# Patient Record
Sex: Male | Born: 1955 | Race: White | Hispanic: No | Marital: Married | Smoking: Never smoker
Health system: Southern US, Community
[De-identification: ages and names within clinical notes are randomized; demographics above are authoritative.]

## PROBLEM LIST (undated history)

## (undated) DIAGNOSIS — G459 Transient cerebral ischemic attack, unspecified: Secondary | ICD-10-CM

## (undated) DIAGNOSIS — C801 Malignant (primary) neoplasm, unspecified: Secondary | ICD-10-CM

## (undated) DIAGNOSIS — I10 Essential (primary) hypertension: Secondary | ICD-10-CM

## (undated) HISTORY — PX: CORONARY ARTERY BYPASS GRAFT: SHX141

---

## 2007-05-24 ENCOUNTER — Emergency Department: Payer: Self-pay | Admitting: Emergency Medicine

## 2008-11-18 ENCOUNTER — Emergency Department: Payer: Self-pay | Admitting: Emergency Medicine

## 2015-10-28 ENCOUNTER — Encounter: Payer: Self-pay | Admitting: Emergency Medicine

## 2015-10-28 ENCOUNTER — Observation Stay: Payer: BLUE CROSS/BLUE SHIELD

## 2015-10-28 ENCOUNTER — Emergency Department: Payer: BLUE CROSS/BLUE SHIELD

## 2015-10-28 ENCOUNTER — Observation Stay
Admit: 2015-10-28 | Discharge: 2015-10-28 | Disposition: A | Discharge status: Home / Self Care | Payer: BLUE CROSS/BLUE SHIELD | Attending: Internal Medicine | Admitting: Internal Medicine

## 2015-10-28 ENCOUNTER — Observation Stay
Admission: EM | Admit: 2015-10-28 | Discharge: 2015-10-29 | Disposition: A | Discharge status: Home / Self Care | Payer: BLUE CROSS/BLUE SHIELD | Attending: Internal Medicine | Admitting: Internal Medicine

## 2015-10-28 DIAGNOSIS — Z6832 Body mass index (BMI) 32.0-32.9, adult: Secondary | ICD-10-CM | POA: Diagnosis not present

## 2015-10-28 DIAGNOSIS — Z801 Family history of malignant neoplasm of trachea, bronchus and lung: Secondary | ICD-10-CM | POA: Insufficient documentation

## 2015-10-28 DIAGNOSIS — Z951 Presence of aortocoronary bypass graft: Secondary | ICD-10-CM | POA: Diagnosis not present

## 2015-10-28 DIAGNOSIS — R739 Hyperglycemia, unspecified: Secondary | ICD-10-CM | POA: Diagnosis not present

## 2015-10-28 DIAGNOSIS — R200 Anesthesia of skin: Secondary | ICD-10-CM | POA: Insufficient documentation

## 2015-10-28 DIAGNOSIS — J3489 Other specified disorders of nose and nasal sinuses: Secondary | ICD-10-CM | POA: Diagnosis not present

## 2015-10-28 DIAGNOSIS — E785 Hyperlipidemia, unspecified: Secondary | ICD-10-CM | POA: Diagnosis not present

## 2015-10-28 DIAGNOSIS — E669 Obesity, unspecified: Secondary | ICD-10-CM

## 2015-10-28 DIAGNOSIS — I10 Essential (primary) hypertension: Secondary | ICD-10-CM

## 2015-10-28 DIAGNOSIS — I639 Cerebral infarction, unspecified: Secondary | ICD-10-CM

## 2015-10-28 DIAGNOSIS — G459 Transient cerebral ischemic attack, unspecified: Principal | ICD-10-CM | POA: Diagnosis present

## 2015-10-28 DIAGNOSIS — Z8249 Family history of ischemic heart disease and other diseases of the circulatory system: Secondary | ICD-10-CM | POA: Insufficient documentation

## 2015-10-28 DIAGNOSIS — R531 Weakness: Secondary | ICD-10-CM | POA: Insufficient documentation

## 2015-10-28 DIAGNOSIS — Z7982 Long term (current) use of aspirin: Secondary | ICD-10-CM | POA: Insufficient documentation

## 2015-10-28 DIAGNOSIS — Z8349 Family history of other endocrine, nutritional and metabolic diseases: Secondary | ICD-10-CM | POA: Insufficient documentation

## 2015-10-28 DIAGNOSIS — Z888 Allergy status to other drugs, medicaments and biological substances status: Secondary | ICD-10-CM | POA: Insufficient documentation

## 2015-10-28 DIAGNOSIS — I2510 Atherosclerotic heart disease of native coronary artery without angina pectoris: Secondary | ICD-10-CM | POA: Diagnosis not present

## 2015-10-28 DIAGNOSIS — N289 Disorder of kidney and ureter, unspecified: Secondary | ICD-10-CM | POA: Diagnosis not present

## 2015-10-28 HISTORY — DX: Essential (primary) hypertension: I10

## 2015-10-28 LAB — PROTIME-INR
INR: 0.95
Prothrombin Time: 12.9 seconds (ref 11.4–15.0)

## 2015-10-28 LAB — APTT: aPTT: 28 seconds (ref 24–36)

## 2015-10-28 LAB — CBC
HEMATOCRIT: 47.4 % (ref 40.0–52.0)
HEMOGLOBIN: 16.2 g/dL (ref 13.0–18.0)
MCH: 30.6 pg (ref 26.0–34.0)
MCHC: 34.2 g/dL (ref 32.0–36.0)
MCV: 89.5 fL (ref 80.0–100.0)
PLATELETS: 205 10*3/uL (ref 150–440)
RBC: 5.29 MIL/uL (ref 4.40–5.90)
RDW: 14.5 % (ref 11.5–14.5)
WBC: 8 10*3/uL (ref 3.8–10.6)

## 2015-10-28 LAB — COMPREHENSIVE METABOLIC PANEL
ALBUMIN: 4.6 g/dL (ref 3.5–5.0)
ALK PHOS: 57 U/L (ref 38–126)
ALT: 26 U/L (ref 17–63)
ANION GAP: 6 (ref 5–15)
AST: 28 U/L (ref 15–41)
BUN: 18 mg/dL (ref 6–20)
CALCIUM: 9.2 mg/dL (ref 8.9–10.3)
CO2: 27 mmol/L (ref 22–32)
Chloride: 107 mmol/L (ref 101–111)
Creatinine, Ser: 1.27 mg/dL — ABNORMAL HIGH (ref 0.61–1.24)
GFR calc Af Amer: >60 mL/min (ref >60)
GFR calc non Af Amer: 59 mL/min — ABNORMAL LOW (ref >60)
GLUCOSE: 106 mg/dL — AB (ref 65–99)
POTASSIUM: 4 mmol/L (ref 3.5–5.1)
SODIUM: 140 mmol/L (ref 135–145)
Total Bilirubin: 0.5 mg/dL (ref 0.3–1.2)
Total Protein: 7.3 g/dL (ref 6.5–8.1)

## 2015-10-28 LAB — DIFFERENTIAL
BASOS ABS: 0.1 10*3/uL (ref 0–0.1)
Basophils Relative: 1 %
Eosinophils Absolute: 0.1 10*3/uL (ref 0–0.7)
Eosinophils Relative: 1 %
LYMPHS ABS: 1.5 10*3/uL (ref 1.0–3.6)
Lymphocytes Relative: 19 %
MONOS PCT: 11 %
Monocytes Absolute: 0.9 10*3/uL (ref 0.2–1.0)
NEUTROS PCT: 68 %
Neutro Abs: 5.4 10*3/uL (ref 1.4–6.5)

## 2015-10-28 LAB — GLUCOSE, CAPILLARY: Glucose-Capillary: 122 mg/dL — ABNORMAL HIGH (ref 65–99)

## 2015-10-28 LAB — TROPONIN I: Troponin I: <0.03 ng/mL (ref <0.031)

## 2015-10-28 MED ORDER — ONDANSETRON HCL 4 MG PO TABS
4.0000 mg | ORAL_TABLET | Freq: Four times a day (QID) | ORAL | Status: DC | PRN
Start: 2015-10-28 — End: 2015-10-29

## 2015-10-28 MED ORDER — TRAZODONE HCL 50 MG PO TABS
25.0000 mg | ORAL_TABLET | Freq: Every evening | ORAL | Status: DC | PRN
  Administered 2015-10-28: 23:00:00 25 mg via ORAL
  Filled 2015-10-28 (×2): qty 1

## 2015-10-28 MED ORDER — ASPIRIN 81 MG PO CHEW
324.0000 mg | CHEWABLE_TABLET | Freq: Once | ORAL | Status: AC
  Administered 2015-10-28: 324 mg via ORAL
  Filled 2015-10-28: qty 4

## 2015-10-28 MED ORDER — ASPIRIN 300 MG RE SUPP: 300.0000 mg | Freq: Every day | RECTAL | Status: DC

## 2015-10-28 MED ORDER — SODIUM CHLORIDE 0.9% FLUSH
3.0000 mL | Freq: Two times a day (BID) | INTRAVENOUS | Status: DC
  Administered 2015-10-28 (×2): 3 mL via INTRAVENOUS

## 2015-10-28 MED ORDER — STROKE: EARLY STAGES OF RECOVERY BOOK
Freq: Once | Status: AC
  Administered 2015-10-28: 11:00:00

## 2015-10-28 MED ORDER — ACETAMINOPHEN 325 MG PO TABS: 650.0000 mg | ORAL_TABLET | Freq: Four times a day (QID) | ORAL | Status: DC | PRN

## 2015-10-28 MED ORDER — ACETAMINOPHEN 650 MG RE SUPP: 650.0000 mg | Freq: Four times a day (QID) | RECTAL | Status: DC | PRN

## 2015-10-28 MED ORDER — NITROGLYCERIN 0.4 MG SL SUBL: 0.4000 mg | SUBLINGUAL_TABLET | SUBLINGUAL | Status: DC | PRN

## 2015-10-28 MED ORDER — VITAMIN D3 25 MCG (1000 UNIT) PO TABS
1000.0000 [IU] | ORAL_TABLET | Freq: Every day | ORAL | Status: DC
  Filled 2015-10-28: qty 1

## 2015-10-28 MED ORDER — ADULT MULTIVITAMIN W/MINERALS CH: 1.0000 | ORAL_TABLET | Freq: Every day | ORAL | Status: DC

## 2015-10-28 MED ORDER — ENOXAPARIN SODIUM 40 MG/0.4ML ~~LOC~~ SOLN
40.0000 mg | SUBCUTANEOUS | Status: DC
  Administered 2015-10-28: 40 mg via SUBCUTANEOUS
  Filled 2015-10-28: qty 0.4

## 2015-10-28 MED ORDER — ONDANSETRON HCL 4 MG/2ML IJ SOLN: 4.0000 mg | Freq: Four times a day (QID) | INTRAMUSCULAR | Status: DC | PRN

## 2015-10-28 MED ORDER — ROSUVASTATIN CALCIUM 20 MG PO TABS
20.0000 mg | ORAL_TABLET | Freq: Every day | ORAL | Status: DC
  Administered 2015-10-28: 22:00:00 20 mg via ORAL
  Filled 2015-10-28: qty 1

## 2015-10-28 MED ORDER — LORAZEPAM 2 MG/ML IJ SOLN
1.0000 mg | Freq: Once | INTRAMUSCULAR | Status: AC
  Administered 2015-10-28: 13:00:00 1 mg via INTRAVENOUS
  Filled 2015-10-28: qty 1

## 2015-10-28 MED ORDER — ASPIRIN 325 MG PO TABS: 325.0000 mg | ORAL_TABLET | Freq: Every day | ORAL | Status: DC

## 2015-10-28 MED ORDER — OXYCODONE HCL 5 MG PO TABS: 5.0000 mg | ORAL_TABLET | ORAL | Status: DC | PRN

## 2015-10-28 MED ORDER — MORPHINE SULFATE (PF) 2 MG/ML IV SOLN: 2.0000 mg | INTRAVENOUS | Status: DC | PRN

## 2015-10-28 NOTE — ED Notes (Signed)
Pt currently speaking to [facility], stroke coordinator at bedside

## 2015-10-28 NOTE — ED Notes (Signed)
Dr. Reynolds at bedside.

## 2015-10-28 NOTE — ED Provider Notes (Signed)
[facility] Emergency Department Provider Note  ____________________________________________  Time seen: Approximately 8:58 AM  I have reviewed the triage vital signs and the nursing notes.   HISTORY  Chief Complaint Extremity Weakness    HPI Joel Miranda is a 60 y.o. male with history of hypertension, history of coronary artery disease status post CABG who presents for evaluation of transient right arm numbness and weakness today which began at 6:30 AM and resolves less than a minute later, sudden onset, moderate, no modifying factors. Patient reports that he was attempting to brush his teeth when he reached for his toothbrush and noted that his right arm was weak, numb and tingly. This resolved quickly. He also had some lightheadedness at that time. No chest pain or difficulty breathing, vomiting, diarrhea, fevers or chills. No recent illness.   Past Medical History  Diagnosis Date  . Hypertension     There are no active problems to display for this patient.   Past Surgical History  Procedure Laterality Date  . Coronary artery bypass graft      No current outpatient prescriptions on file.  Allergies Review of patient's allergies indicates no known allergies.  No family history on file.  Social History Social History  Substance Use Topics  . Smoking status: Never Smoker   . Smokeless tobacco: None  . Alcohol Use: None    Review of Systems Constitutional: No fever/chills Eyes: No visual changes. ENT: No sore throat. Cardiovascular: Denies chest pain. Respiratory: Denies shortness of breath. Gastrointestinal: No abdominal pain.  No nausea, no vomiting.  No diarrhea.  No constipation. Genitourinary: Negative for dysuria. Musculoskeletal: Negative for back pain. Skin: Negative for rash. Neurological: Negative for headaches, focal weakness or numbness.  10-point ROS otherwise  negative.  ____________________________________________   PHYSICAL EXAM:  Filed Vitals:   10/28/15 0857 10/28/15 0930  BP: 148/89 132/74  Pulse: 72 75  Temp: 97.8 F (36.6 C)   TempSrc: Oral   Resp: 18 14  Weight: 250 lb 12.8 oz (113.762 kg)   SpO2: 99% 96%      Constitutional: Alert and oriented. Well appearing and in no acute distress. Eyes: Conjunctivae are normal. PERRL. EOMI. Head: Atraumatic. Nose: No congestion/rhinnorhea. Mouth/Throat: Mucous membranes are moist.  Oropharynx non-erythematous. Neck: No stridor.  Supple without meningismus. Cardiovascular: Normal rate, regular rhythm. Grossly normal heart sounds.  Good peripheral circulation. Respiratory: Normal respiratory effort.  No retractions. Lungs CTAB. Gastrointestinal: Soft and nontender. No distention.  No CVA tenderness. Genitourinary: deferred Musculoskeletal: No lower extremity tenderness nor edema.  No joint effusions. Neurologic:  Normal speech and language. No gross focal neurologic deficits are appreciated. 5 out of 5 strength in bilateral upper and lower extremities, sensation intact to light touch throughout, cranial nerves II through XII intact, normal finger-nose-finger. Skin:  Skin is warm, dry and intact. No rash noted. Psychiatric: Mood and affect are normal. Speech and behavior are normal.  ____________________________________________   LABS (all labs ordered are listed, but only abnormal results are displayed)  Labs Reviewed  COMPREHENSIVE METABOLIC PANEL - Abnormal; Notable for the following:    Glucose, Bld 106 (*)    Creatinine, Ser 1.27 (*)    GFR calc non Af Amer 59 (*)    All other components within normal limits  GLUCOSE, CAPILLARY - Abnormal; Notable for the following:    Glucose-Capillary 122 (*)    All other components within normal limits  PROTIME-INR  APTT  CBC  TROPONIN I  DIFFERENTIAL  CBG MONITORING,  ED   ____________________________________________  EKG  ED  ECG REPORT I, Joanne Gavel, the attending physician, personally viewed and interpreted this ECG.   Date: 10/28/2015  EKG Time: 08:57  Rate: 80  Rhythm: normal sinus rhythm  Axis: normal  Intervals: Borderline nonspecific intraventricular conduction delay.  ST&T Change: No acute ST elevation. Nonspecific T-wave abnormality in lead 1, aVL.  ____________________________________________  RADIOLOGY  CT head  IMPRESSION: No acute intracranial abnormality is noted.   ____________________________________________   PROCEDURES  Procedure(s) performed: None  Critical Care performed: No  ____________________________________________   INITIAL IMPRESSION / ASSESSMENT AND PLAN / ED COURSE  Pertinent labs & imaging results that were available during my care of the patient were reviewed by me and considered in my medical decision making (see chart for details).  Joel Miranda is a 60 y.o. male with history of hypertension, history of coronary artery disease status post CABG who presents for evaluation of transient right arm numbness and weakness today which is completely resolved. On exam, he is well-appearing and in no acute distress. Vital signs stable, he is afebrile. NIH stroke scale is 0, he has an intact neurological examination. Code stroke was initiated on his arrival, he was evaluated by the telemetry neurologist on call and given rapid resolution of symptoms, no indication for TPA administration. We'll give full dose aspirin, admit for further stroke workup. CT head negative. Basic labs pending.   ----------------------------------------- 10:05 AM on 10/28/2015 ----------------------------------------- Labs reviewed. CBC unremarkable. Negative troponin. CMP only remarkable for mild creatinine elevation at 1.27 though baseline is unknown. Patient remains asymptomatic. Case discussed with hospice for admission at this  time.  ____________________________________________   FINAL CLINICAL IMPRESSION(S) / ED DIAGNOSES  Final diagnoses:  Transient cerebral ischemia, unspecified transient cerebral ischemia type      Joanne Gavel, MD 10/28/15 1006

## 2015-10-28 NOTE — Progress Notes (Signed)
Change of shift.  Assigned pt.  Pt off the unit to [facility] at this time. Dorna Bloom RN

## 2015-10-28 NOTE — ED Notes (Signed)
Pt resting in bed, given strict instructions to notify RN for any return of symptoms

## 2015-10-28 NOTE — Progress Notes (Signed)
*  PRELIMINARY RESULTS* Echocardiogram 2D Echocardiogram has been performed.  Joel Miranda 10/28/2015, 7:34 PM

## 2015-10-28 NOTE — ED Notes (Signed)
Called code stroke to 30  60, called SOC spoke to [place]

## 2015-10-28 NOTE — H&P (Signed)
[place] [facility] [place] NAME: Joel Miranda    MR#:  [id number]  DATE OF BIRTH:  April 09, 1956   DATE OF ADMISSION:  10/28/2015  PRIMARY CARE PHYSICIAN: Mayford Knife, MD   REQUESTING/REFERRING PHYSICIAN: Edd Fabian  CHIEF COMPLAINT:   Chief Complaint  Patient presents with  . Extremity Weakness    HISTORY OF PRESENT ILLNESS:  Joel Miranda  is a 60 y.o. male with a known history of Essential hypertension, coronary artery disease status post bypass surgery who is presenting with right arm weakness. Last known normal 6:30 10/28/2015, no tPA given severity of symptoms, patient experienced right arm numbness and weakness brushing his teeth getting ready this morning. He states that his arm drop to his side. The symptoms were transient with complete resolution of symptoms prior to arrival to the hospital. No further symptomatology, no current symptomatology  PAST MEDICAL HISTORY:   Past Medical History  Diagnosis Date  . Hypertension     PAST SURGICAL HISTORY:   Past Surgical History  Procedure Laterality Date  . Coronary artery bypass graft      SOCIAL HISTORY:   Social History  Substance Use Topics  . Smoking status: Never Smoker   . Smokeless tobacco: Not on file  . Alcohol Use: Not on file    FAMILY HISTORY:   Family History  Problem Relation Age of Onset  . Hypertension Other     DRUG ALLERGIES:   Allergies  Allergen Reactions  . Lisinopril Other (See Comments)    CAUSES DRY MOUTH     REVIEW OF SYSTEMS:  REVIEW OF SYSTEMS:  CONSTITUTIONAL: Denies fevers, chills, fatigue, weakness.  EYES: Denies blurred vision, double vision, or eye pain.  EARS, NOSE, THROAT: Denies tinnitus, ear pain, hearing loss.  RESPIRATORY: denies cough, shortness of breath, wheezing  CARDIOVASCULAR: Denies chest pain, palpitations, edema.  GASTROINTESTINAL: Denies nausea, vomiting, diarrhea, abdominal pain.  GENITOURINARY: Denies  dysuria, hematuria.  ENDOCRINE: Denies nocturia or thyroid problems. HEMATOLOGIC AND LYMPHATIC: Denies easy bruising or bleeding.  SKIN: Denies rash or lesions.  MUSCULOSKELETAL: Denies pain in neck, back, shoulder, knees, hips, or further arthritic symptoms.  NEUROLOGIC: Positive but resolved paralysis, paresthesias.  PSYCHIATRIC: Denies anxiety or depressive symptoms. Otherwise full review of systems performed by me is negative.   MEDICATIONS AT HOME:   Prior to Admission medications   Medication Sig Start Date End Date Taking? Authorizing Provider  aspirin EC 81 MG tablet Take 1 tablet by mouth daily.   Yes Historical Provider, MD  cholecalciferol (VITAMIN D) 1000 units tablet Take 1,000 Units by mouth daily.   Yes Historical Provider, MD  clobetasol (OLUX) 0.05 % topical foam Apply 1 application topically daily as needed. 07/13/15 07/12/16 Yes Historical Provider, MD  losartan (COZAAR) 50 MG tablet Take 1 tablet by mouth 2 (two) times daily. 06/28/15  Yes Historical Provider, MD  Multiple Vitamin (MULTIVITAMIN WITH MINERALS) TABS tablet Take 1 tablet by mouth daily.   Yes Historical Provider, MD  nitroGLYCERIN (NITROSTAT) 0.4 MG SL tablet Place 0.4 mg under the tongue every 5 (five) minutes as needed.  07/20/14  Yes Historical Provider, MD  rosuvastatin (CRESTOR) 20 MG tablet Take 1 tablet by mouth at bedtime.  04/22/15  Yes Historical Provider, MD  triamcinolone cream (KENALOG) 0.1 % Apply 1 application topically as needed. 07/13/15  Yes Historical Provider, MD      VITAL SIGNS:  Blood pressure 131/80, pulse 80, temperature 97.8 F (36.6 C), temperature source Oral, resp.  rate 12, weight 113.762 kg (250 lb 12.8 oz), SpO2 98 %.  PHYSICAL EXAMINATION:  VITAL SIGNS: Filed Vitals:   10/28/15 0930 10/28/15 1030  BP: 132/74 131/80  Pulse: 75 80  Temp:    Resp: 14 12   GENERAL:59 y.o.male currently in no acute distress.  HEAD: Normocephalic, atraumatic.  EYES: Pupils equal, round,  reactive to light. Extraocular muscles intact. No scleral icterus.  MOUTH: Moist mucosal membrane. Dentition intact. No abscess noted.  EAR, NOSE, THROAT: Clear without exudates. No external lesions.  NECK: Supple. No thyromegaly. No nodules. No JVD.  PULMONARY: Clear to ascultation, without wheeze rails or rhonci. No use of accessory muscles, Good respiratory effort. good air entry bilaterally CHEST: Nontender to palpation.  CARDIOVASCULAR: S1 and S2. Regular rate and rhythm. No murmurs, rubs, or gallops. No edema. Pedal pulses 2+ bilaterally.  GASTROINTESTINAL: Soft, nontender, nondistended. No masses. Positive bowel sounds. No hepatosplenomegaly.  MUSCULOSKELETAL: No swelling, clubbing, or edema. Range of motion full in all extremities.  NEUROLOGIC: Cranial nerves II through XII are intact. Pronator drift within normal limits strength 5/5 all extremities including proximal and distal flexion and extension gait deferred No gross focal neurological deficits. Sensation intact. Reflexes intact.  SKIN: No ulceration, lesions, rashes, or cyanosis. Skin warm and dry. Turgor intact.  PSYCHIATRIC: Mood, affect within normal limits. The patient is awake, alert and oriented x 3. Insight, judgment intact.    LABORATORY PANEL:   CBC  Recent Labs Lab 10/28/15 0857  WBC 8.0  HGB 16.2  HCT 47.4  PLT 205   ------------------------------------------------------------------------------------------------------------------  Chemistries   Recent Labs Lab 10/28/15 0857  NA 140  K 4.0  CL 107  CO2 27  GLUCOSE 106*  BUN 18  CREATININE 1.27*  CALCIUM 9.2  AST 28  ALT 26  ALKPHOS 57  BILITOT 0.5   ------------------------------------------------------------------------------------------------------------------  Cardiac Enzymes  Recent Labs Lab 10/28/15 0857  TROPONINI <0.03    ------------------------------------------------------------------------------------------------------------------  RADIOLOGY:  Ct Head Wo Contrast  10/28/2015  CLINICAL DATA:  Right arm weakness EXAM: CT HEAD WITHOUT CONTRAST TECHNIQUE: Contiguous axial images were obtained from the base of the skull through the vertex without intravenous contrast. COMPARISON:  None. FINDINGS: Bony calvarium is intact. No gross soft tissue abnormality is seen. No findings to suggest acute hemorrhage, acute infarction or space-occupying mass lesion are noted. IMPRESSION: No acute intracranial abnormality is noted. Critical Value/emergent results were called by telephone at the time of interpretation on 10/28/2015 at 8:56 am to Dr. Lenise Arena , who verbally acknowledged these results. Electronically Signed   By: Inez Catalina M.D.   On: 10/28/2015 08:56    EKG:   Orders placed or performed during the hospital encounter of 10/28/15  . ED EKG  . ED EKG  . EKG 12-Lead  . EKG 12-Lead    IMPRESSION AND PLAN:   60 year old Caucasian gentleman history of essential hypertension as well as coronary artery disease status post bypass surgery who is presenting with transient right arm weakness  1.tia, unspecified: Initiate aspirin/statin therapy, stroke protocol/precautions, place on telemetry, trend cardiac enzymes, check MRI brain, complete echocardiogram, lipid panel, hemoglobin A1c, bilateral carotid Doppler, permissive hypertension first 24 hours treating blood pressure only greater than 220/120, neurologist already evaluated patient this morning  2. Essential hypertension: Permissive hypertension first 24 hours as stated above 3. Hyperlipidemia unspecified continue his statin therapy patient actually recently had fasting lipid panel drawn on 10/13/2015 at [facility] results are as follows Total cholesterol 126 Triglycerides 169 HDL 40  LDL 52  4. Venous thrombi embolism prophylactic: Lovenox     All the  records are reviewed and case discussed with ED provider. Management plans discussed with the patient, family and they are in agreement.  CODE STATUS: Full  TOTAL TIME TAKING CARE OF THIS PATIENT: 33 minutes.    Ngai Parcell,  Karenann Cai.D on 10/28/2015 at 10:49 AM  Between 7am to 6pm - Pager - [phone]  After 6pm: House Pager: - [phone]  [facility]  Office  [phone]  CC: Primary care physician; Mayford Knife, MD

## 2015-10-28 NOTE — Consult Note (Signed)
Referring Physician: Edd Fabian    Chief Complaint: Right arm weakness  HPI: Joel Miranda is an 60 y.o. male who reports that after awakening this morning he had an episode of right upper extremity weakness and numbness while brushing his teeth.  Patient also had some associated dizziness as well.  His arm just fell to his side and he was unable to use it for about a minute.  After a few minutes all symptoms resolved.  Patient was encouraged by family members to present for evaluation.  Initial NIHSS of 0.    Date last known well: 10/28/2015  Time last known well: Time: 06:30 tPA Given: No: Resolution of symptoms  MRankin: 0  Past Medical History  Diagnosis Date  . Hypertension    CAD  Past Surgical History  Procedure Laterality Date  . Coronary artery bypass graft      Family history:  Father deceased.  Had a history of CAD, hyperlipidemia, HTN and skin cancer.  Mother deceased with a history of lung cancer, thyroid disease and HTN.  Two paternal uncles with PD.     Social History:  reports that he has never smoked. He does not have any smokeless tobacco history on file. His alcohol and drug histories are not on file.  Allergies: No Known Allergies  Medications: I have reviewed the patient's current medications. Prior to Admission:  Prior to Admission medications   Vitamin D3, Olux, Crestor, Hycodan, Cozaar, ASA    ROS: History obtained from the patient  General ROS: negative for - chills, fatigue, fever, night sweats, weight gain or weight loss Psychological ROS: negative for - behavioral disorder, hallucinations, memory difficulties, mood swings or suicidal ideation Ophthalmic ROS: negative for - blurry vision, double vision, eye pain or loss of vision ENT ROS: negative for - epistaxis, nasal discharge, oral lesions, sore throat, tinnitus or vertigo Allergy and Immunology ROS: negative for - hives or itchy/watery eyes Hematological and Lymphatic ROS: negative for - bleeding  problems, bruising or swollen lymph nodes Endocrine ROS: negative for - galactorrhea, hair pattern changes, polydipsia/polyuria or temperature intolerance Respiratory ROS: negative for - cough, hemoptysis, shortness of breath or wheezing Cardiovascular ROS: negative for - chest pain, dyspnea on exertion, edema or irregular heartbeat Gastrointestinal ROS: negative for - abdominal pain, diarrhea, hematemesis, nausea/vomiting or stool incontinence Genito-Urinary ROS: negative for - dysuria, hematuria, incontinence or urinary frequency/urgency Musculoskeletal ROS: back pain Neurological ROS: as noted in HPI Dermatological ROS: negative for rash and skin lesion changes  Physical Examination: Blood pressure 132/74, pulse 75, temperature 97.8 F (36.6 C), temperature source Oral, resp. rate 14, weight 113.762 kg (250 lb 12.8 oz), SpO2 96 %.  HEENT-  Normocephalic, no lesions, without obvious abnormality.  Normal external eye and conjunctiva.  Normal TM's bilaterally.  Normal auditory canals and external ears. Normal external nose, mucus membranes and septum.  Normal pharynx. Cardiovascular- S1, S2 normal, pulses palpable throughout   Lungs- chest clear, no wheezing, rales, normal symmetric air entry Abdomen- soft, non-tender; bowel sounds normal; no masses,  no organomegaly Extremities- no edema Lymph-no adenopathy palpable Musculoskeletal-no joint tenderness, deformity or swelling Skin-warm and dry, no hyperpigmentation, vitiligo, or suspicious lesions  Neurological Examination Mental Status: Alert, oriented, thought content appropriate.  Speech fluent without evidence of aphasia.  Able to follow 3 step commands without difficulty. Cranial Nerves: II: Discs flat bilaterally; Visual fields grossly normal, pupils equal, round, reactive to light and accommodation III,IV, VI: ptosis not present, extra-ocular motions intact bilaterally V,VII: smile symmetric, facial  light touch sensation normal  bilaterally VIII: hearing normal bilaterally IX,X: gag reflex present XI: bilateral shoulder shrug XII: midline tongue extension Motor: Right : Upper extremity   5/5    Left:     Upper extremity   5/5  Lower extremity   5/5     Lower extremity   5/5 Tone and bulk:normal tone throughout; no atrophy noted Sensory: Pinprick and light touch intact throughout, bilaterally Deep Tendon Reflexes: 2+ and symmetric throughout Plantars: Right: downgoing   Left: downgoing Cerebellar: Normal finger-to-nose and normal heel-to-shin testing bilaterally Gait: normal gait and station      Laboratory Studies:  Basic Metabolic Panel:  Recent Labs Lab 10/28/15 0857  NA 140  K 4.0  CL 107  CO2 27  GLUCOSE 106*  BUN 18  CREATININE 1.27*  CALCIUM 9.2    Liver Function Tests:  Recent Labs Lab 10/28/15 0857  AST 28  ALT 26  ALKPHOS 57  BILITOT 0.5  PROT 7.3  ALBUMIN 4.6   No results for input(s): LIPASE, AMYLASE in the last 168 hours. No results for input(s): AMMONIA in the last 168 hours.  CBC:  Recent Labs Lab 10/28/15 0857  WBC 8.0  HGB 16.2  HCT 47.4  MCV 89.5  PLT 205    Cardiac Enzymes:  Recent Labs Lab 10/28/15 0857  TROPONINI <0.03    BNP: Invalid input(s): POCBNP  CBG:  Recent Labs Lab 10/28/15 0903  GLUCAP 122*    Microbiology: No results found for this or any previous visit.  Coagulation Studies:  Recent Labs  10/28/15 0857  LABPROT 12.9  INR 0.95    Urinalysis: No results for input(s): COLORURINE, LABSPEC, PHURINE, GLUCOSEU, HGBUR, BILIRUBINUR, KETONESUR, PROTEINUR, UROBILINOGEN, NITRITE, LEUKOCYTESUR in the last 168 hours.  Invalid input(s): APPERANCEUR  Lipid Panel: No results found for: CHOL, TRIG, HDL, CHOLHDL, VLDL, LDLCALC  HgbA1C: No results found for: HGBA1C  Urine Drug Screen:  No results found for: LABOPIA, COCAINSCRNUR, LABBENZ, AMPHETMU, THCU, LABBARB  Alcohol Level: No results for input(s): ETH in the last 168  hours.  Other results: EKG: sinus rhythm at 80 bpm.  Imaging: Ct Head Wo Contrast  10/28/2015  CLINICAL DATA:  Right arm weakness EXAM: CT HEAD WITHOUT CONTRAST TECHNIQUE: Contiguous axial images were obtained from the base of the skull through the vertex without intravenous contrast. COMPARISON:  None. FINDINGS: Bony calvarium is intact. No gross soft tissue abnormality is seen. No findings to suggest acute hemorrhage, acute infarction or space-occupying mass lesion are noted. IMPRESSION: No acute intracranial abnormality is noted. Critical Value/emergent results were called by telephone at the time of interpretation on 10/28/2015 at 8:56 am to Dr. Lenise Arena , who verbally acknowledged these results. Electronically Signed   By: Inez Catalina M.D.   On: 10/28/2015 08:56    Assessment: 60 y.o. male presenting after an episode of right arm weakness.  Patient has now returned to baseline.  NIHSS of 0.  On ASA at home.  Head CT personally reviewed and shows no acute changes.  Concerns is for TIA.  Patient with multiple vascular risk factors.  Further work up recommended.    Stroke Risk Factors - hyperlipidemia and hypertension  Plan: 1. HgbA1c, fasting lipid panel 2. MRI, MRA  of the brain without contrast 3. PT consult, OT consult, Speech consult 4. Echocardiogram 5. Carotid dopplers 6. Prophylactic therapy-Antiplatelet med: Aspirin - dose 325mg  today.  To start Plavix 75mg  in AM and to continue daily.   7. NPO until  RN stroke swallow screen 8. Telemetry monitoring 9. Frequent neuro checks   Case discussed with Dr. Cristela Felt, MD Neurology [phone] 10/28/2015, 9:54 AM

## 2015-10-28 NOTE — ED Notes (Addendum)
Pt reports at 0630 he was brushing his teeth and noticed right arm numbness and weakness, states he did not have any strength in his hand, pt then stated he felt dizzy and faint, reports the whole episode lasted 30 seconds and then resolved, at present pt is AO x4 with no symptoms, NIH scale 0

## 2015-10-28 NOTE — ED Notes (Signed)
Reports right arm went numb for several minutes around 0630 this am.  Resolved at this time.  No facial droop, grips equal.

## 2015-10-28 NOTE — Progress Notes (Signed)
Speech Therapy Note: reviewed chart notes; met w/ pt. Pt denied any difficulty swallowing; conversed at conversational level w/ no deficits apparent. Pt stated he was at his baseline w/ no deficits he felt. No skilled ST services indicated at this time. NSG to reconsult if any change in status while admitted. Pt agreed.

## 2015-10-29 DIAGNOSIS — R739 Hyperglycemia, unspecified: Secondary | ICD-10-CM

## 2015-10-29 DIAGNOSIS — I10 Essential (primary) hypertension: Secondary | ICD-10-CM

## 2015-10-29 DIAGNOSIS — N289 Disorder of kidney and ureter, unspecified: Secondary | ICD-10-CM

## 2015-10-29 DIAGNOSIS — E669 Obesity, unspecified: Secondary | ICD-10-CM

## 2015-10-29 DIAGNOSIS — E785 Hyperlipidemia, unspecified: Secondary | ICD-10-CM

## 2015-10-29 LAB — LIPID PANEL
CHOL/HDL RATIO: 2.8 ratio
Cholesterol: 109 mg/dL (ref 0–200)
HDL: 39 mg/dL — ABNORMAL LOW (ref >40)
LDL CALC: 54 mg/dL (ref 0–99)
Triglycerides: 78 mg/dL (ref <150)
VLDL: 16 mg/dL (ref 0–40)

## 2015-10-29 LAB — ECHOCARDIOGRAM COMPLETE
HEIGHTINCHES: 73 in
WEIGHTICAEL: 3904 [oz_av]

## 2015-10-29 LAB — BASIC METABOLIC PANEL
Anion gap: 7 (ref 5–15)
BUN: 15 mg/dL (ref 6–20)
CHLORIDE: 107 mmol/L (ref 101–111)
CO2: 27 mmol/L (ref 22–32)
CREATININE: 1.15 mg/dL (ref 0.61–1.24)
Calcium: 8.9 mg/dL (ref 8.9–10.3)
Glucose, Bld: 109 mg/dL — ABNORMAL HIGH (ref 65–99)
Potassium: 4.4 mmol/L (ref 3.5–5.1)
SODIUM: 141 mmol/L (ref 135–145)

## 2015-10-29 LAB — HEMOGLOBIN A1C: HEMOGLOBIN A1C: 5.9 % (ref 4.0–6.0)

## 2015-10-29 MED ORDER — CLOPIDOGREL BISULFATE 75 MG PO TABS
75.0000 mg | ORAL_TABLET | Freq: Every day | ORAL | Status: DC
  Administered 2015-10-29: 75 mg via ORAL
  Filled 2015-10-29: qty 1

## 2015-10-29 MED ORDER — CLOPIDOGREL BISULFATE 75 MG PO TABS: 75.0000 mg | ORAL_TABLET | Freq: Every day | ORAL | Status: AC

## 2015-10-29 NOTE — Discharge Summary (Signed)
[facility] [place] NAME: Joel Miranda    MR#:  [id number]  DATE OF BIRTH:  [birth year]  DATE OF ADMISSION:  10/28/2015 ADMITTING PHYSICIAN: Lytle Butte, MD  DATE OF DISCHARGE: 10/29/2015  8:30 AM  PRIMARY CARE PHYSICIAN: Mayford Knife, MD     ADMISSION DIAGNOSIS:  Transient cerebral ischemia, unspecified transient cerebral ischemia type [G45.9]  DISCHARGE DIAGNOSIS:  Active Problems:   TIA (transient ischemic attack)   Essential hypertension   Hyperlipidemia   Obesity   Renal insufficiency   Hyperglycemia   SECONDARY DIAGNOSIS:   Past Medical History  Diagnosis Date  . Hypertension     .pro HOSPITAL COURSE:   Patient is 60 year old male with past medical history significant for history of coronary artery disease , status post coronary artery bypass grafting, hypertension, hyperlipidemia who presents to the hospital with complaints of right arm weakness, numbness, lasting for approximately 2 minutes with complete resolution of his symptoms. On arrival to emergency room, he underwent CT scan of the head, which was normal. Patient had MRI/MRA of his brain vessels revealing no significant intracranial abnormality. Ultrasound of carotid arteries was also unremarkable except of minimal amount of bilateral atherosclerotic plaque, right greater than left was noted, not resulting in hemodynamically significant stenosis within either internal carotid artery. Patient was admitted to the hospital for further evaluation and treatment. He was seen by neurologist, Dr. Jules Husbands who recommended to continue aspirin therapy. On the day of admission, but changed to Plavix by the day of discharge. Patient had a lipid panel checked while in the hospital, showing total cholesterol of 109, triglycerides 78, HDL 39, LDL 54. Patient was advised to continue his home doses of Crestor. Hemoglobin A1c was ordered, pending. Echocardiogram was performed  on 10/28/2015, pending. Patient was felt. To be stable to be discharged home with primary care physician's follow-up  within 1 week after discharge   discussion by problem #1. TIA with right upper extremity numbness, weakness, telemetry did not show arrhythmias, MRI of the brain as well as MRA, carotid ultrasound were unremarkable. Lipid panel was normal, patient was seen by neurologist who recommended to initiate him on Plavix therapy, patient was advised to continue home Crestor dose . Echocardiogram was done during this admission, pending results #2. Essential hypertension, relatively well controlled, continue outpatient medications. #3. Hyperlipidemia, LDL is below 70, continue Crestor. #4. Hyperglycemia, hemoglobin A1c was ordered, pending #5 renal insufficiency, chronic, follow as outpatient, seems to be stable  DISCHARGE CONDITIONS:   Stable   CONSULTS OBTAINED:  Treatment Team:  Catarina Hartshorn, MD Lytle Butte, MD  DRUG ALLERGIES:   Allergies  Allergen Reactions  . Lisinopril Other (See Comments)    CAUSES DRY MOUTH     DISCHARGE MEDICATIONS:   Discharge Medication List as of 10/29/2015  8:23 AM    START taking these medications   Details  clopidogrel (PLAVIX) 75 MG tablet Take 1 tablet (75 mg total) by mouth daily., Starting 10/29/2015, Until Discontinued, Normal      CONTINUE these medications which have NOT CHANGED   Details  cholecalciferol (VITAMIN D) 1000 units tablet Take 1,000 Units by mouth daily., Until Discontinued, Historical Med    clobetasol (OLUX) 0.05 % topical foam Apply 1 application topically daily as needed., Starting 07/13/2015, Until Thu 07/12/16, Historical Med    losartan (COZAAR) 50 MG tablet Take 1 tablet by mouth 2 (two) times daily., Starting 06/28/2015, Until Discontinued, Historical Med    Multiple  Vitamin (MULTIVITAMIN WITH MINERALS) TABS tablet Take 1 tablet by mouth daily., Until Discontinued, Historical Med    nitroGLYCERIN  (NITROSTAT) 0.4 MG SL tablet Place 0.4 mg under the tongue every 5 (five) minutes as needed. , Starting 07/20/2014, Until Discontinued, Historical Med    rosuvastatin (CRESTOR) 20 MG tablet Take 1 tablet by mouth at bedtime. , Starting 04/22/2015, Until Discontinued, Historical Med    triamcinolone cream (KENALOG) 0.1 % Apply 1 application topically as needed., Starting 07/13/2015, Until Discontinued, Historical Med      STOP taking these medications     aspirin EC 81 MG tablet          DISCHARGE INSTRUCTIONS:    Patient is to follow-up with primary care physician as outpatient. Echocardiogram results should be followed up with.   If you experience worsening of your admission symptoms, develop shortness of breath, life threatening emergency, suicidal or homicidal thoughts you must seek medical attention immediately by calling 911 or calling your MD immediately  if symptoms less severe.  You Must read complete instructions/literature along with all the possible adverse reactions/side effects for all the Medicines you take and that have been prescribed to you. Take any new Medicines after you have completely understood and accept all the possible adverse reactions/side effects.   Please note  You were cared for by a hospitalist during your hospital stay. If you have any questions about your discharge medications or the care you received while you were in the hospital after you are discharged, you can call the unit and asked to speak with the hospitalist on call if the hospitalist that took care of you is not available. Once you are discharged, your primary care physician will handle any further medical issues. Please note that NO REFILLS for any discharge medications will be authorized once you are discharged, as it is imperative that you return to your primary care physician (or establish a relationship with a primary care physician if you do not have one) for your aftercare needs so that they  can reassess your need for medications and monitor your lab values.    Today   CHIEF COMPLAINT:   Chief Complaint  Patient presents with  . Extremity Weakness    HISTORY OF PRESENT ILLNESS:  Joel Miranda  is a 60 y.o. male with a known history of coronary artery disease , status post coronary artery bypass grafting, hypertension, hyperlipidemia who presents to the hospital with complaints of right arm weakness, numbness, lasting for approximately 2 minutes with complete resolution of his symptoms. On arrival to emergency room, he underwent CT scan of the head, which was normal. Patient had MRI/MRA of his brain vessels revealing no significant intracranial abnormality. Ultrasound of carotid arteries was also unremarkable except of minimal amount of bilateral atherosclerotic plaque, right greater than left was noted, not resulting in hemodynamically significant stenosis within either internal carotid artery. Patient was admitted to the hospital for further evaluation and treatment. He was seen by neurologist, Dr. Jules Husbands who recommended to continue aspirin therapy. On the day of admission, but changed to Plavix by the day of discharge. Patient had a lipid panel checked while in the hospital, showing total cholesterol of 109, triglycerides 78, HDL 39, LDL 54. Patient was advised to continue his home doses of Crestor. Hemoglobin A1c was ordered, pending. Echocardiogram was performed on 10/28/2015, pending. Patient was felt. To be stable to be discharged home with primary care physician's follow-up  within 1 week after discharge   discussion  by problem #1. TIA with right upper extremity numbness, weakness, telemetry did not show arrhythmias, MRI of the brain as well as MRA, carotid ultrasound were unremarkable. Lipid panel was normal, patient was seen by neurologist who recommended to initiate him on Plavix therapy, patient was advised to continue home Crestor dose . Echocardiogram was done during this  admission, pending results #2. Essential hypertension, relatively well controlled, continue outpatient medications. #3. Hyperlipidemia, LDL is below 70, continue Crestor. #4. Hyperglycemia, hemoglobin A1c was ordered, pending #5 renal insufficiency, chronic, follow as outpatient, seems to be stable    VITAL SIGNS:  Blood pressure 119/74, pulse 65, temperature 97.5 F (36.4 C), temperature source Oral, resp. rate 20, height 6\' 1"  (1.854 m), weight 110.678 kg (244 lb), SpO2 99 %.  I/O:   Intake/Output Summary (Last 24 hours) at 10/29/15 1057 Last data filed at 10/28/15 1900  Gross per 24 hour  Intake    480 ml  Output      0 ml  Net    480 ml    PHYSICAL EXAMINATION:  GENERAL:  60 y.o.-year-old patient lying in the bed with no acute distress.  EYES: Pupils equal, round, reactive to light and accommodation. No scleral icterus. Extraocular muscles intact.  HEENT: Head atraumatic, normocephalic. Oropharynx and nasopharynx clear.  NECK:  Supple, no jugular venous distention. No thyroid enlargement, no tenderness.  LUNGS: Normal breath sounds bilaterally, no wheezing, rales,rhonchi or crepitation. No use of accessory muscles of respiration.  CARDIOVASCULAR: S1, S2 normal. No murmurs, rubs, or gallops.  ABDOMEN: Soft, non-tender, non-distended. Bowel sounds present. No organomegaly or mass.  EXTREMITIES: No pedal edema, cyanosis, or clubbing.  NEUROLOGIC: Cranial nerves II through XII are intact. Muscle strength 5/5 in all extremities. Sensation intact. Gait not checked.  PSYCHIATRIC: The patient is alert and oriented x 3.  SKIN: No obvious rash, lesion, or ulcer.   DATA REVIEW:   CBC  Recent Labs Lab 10/28/15 0857  WBC 8.0  HGB 16.2  HCT 47.4  PLT 205    Chemistries   Recent Labs Lab 10/28/15 0857 10/29/15 0519  NA 140 141  K 4.0 4.4  CL 107 107  CO2 27 27  GLUCOSE 106* 109*  BUN 18 15  CREATININE 1.27* 1.15  CALCIUM 9.2 8.9  AST 28  --   ALT 26  --   ALKPHOS  57  --   BILITOT 0.5  --     Cardiac Enzymes  Recent Labs Lab 10/28/15 0857  TROPONINI <0.03    Microbiology Results  No results found for this or any previous visit.  RADIOLOGY:  Ct Head Wo Contrast  10/28/2015  CLINICAL DATA:  Right arm weakness EXAM: CT HEAD WITHOUT CONTRAST TECHNIQUE: Contiguous axial images were obtained from the base of the skull through the vertex without intravenous contrast. COMPARISON:  None. FINDINGS: Bony calvarium is intact. No gross soft tissue abnormality is seen. No findings to suggest acute hemorrhage, acute infarction or space-occupying mass lesion are noted. IMPRESSION: No acute intracranial abnormality is noted. Critical Value/emergent results were called by telephone at the time of interpretation on 10/28/2015 at 8:56 am to Dr. Lenise Arena , who verbally acknowledged these results. Electronically Signed   By: Inez Catalina M.D.   On: 10/28/2015 08:56   Mr Brain Wo Contrast  10/28/2015  CLINICAL DATA:  Stroke.  Right arm weakness. EXAM: MRI HEAD WITHOUT CONTRAST MRA HEAD WITHOUT CONTRAST TECHNIQUE: Multiplanar, multiecho pulse sequences of the brain and surrounding structures were obtained  without intravenous contrast. Angiographic images of the head were obtained using MRA technique without contrast. COMPARISON:  CT 10/20/2015 FINDINGS: MRI HEAD FINDINGS Negative for acute infarct. Negative for chronic ischemic change. Negative for demyelinating disease. Ventricle size normal.  Cerebral volume normal for age. Negative for intracranial hemorrhage.  Negative for mass or edema. Pituitary normal in size. Normal skullbase. Heterogeneous bone marrow in the cervical spine, uncertain significance. No focal mass lesion. Circle of Willis patent. Normal orbit.  Mild mucosal edema paranasal sinuses bilaterally. MRA HEAD FINDINGS Left vertebral dominant and widely patent. Left PICA patent. Right vertebral artery is markedly hypoplastic. Right PICA patent. Basilar  widely patent. Left AICA patent. Fetal origin right posterior cerebral artery with hypoplastic right P1 segment. Superior cerebellar and posterior cerebral arteries patent bilaterally. Cavernous carotid widely patent bilaterally without stenosis or aneurysm. Anterior and middle cerebral arteries widely patent without stenosis or occlusion. Negative for cerebral aneurysm IMPRESSION: No significant intracranial abnormality. Mild mucosal edema paranasal sinuses. Heterogeneous bone marrow in the cervical spine without focal mass lesion. Questionable significance. Negative MRA head Electronically Signed   By: Franchot Gallo M.D.   On: 10/28/2015 14:04   US Carotid Bilateral  10/28/2015  CLINICAL DATA:  Potential stroke/TIA this morning with right arm numbness and tingling for several minutes. History of hypertension, CAD (post CABG) and hyperlipidemia. EXAM: BILATERAL CAROTID DUPLEX ULTRASOUND TECHNIQUE: Pearline Cables scale imaging, color Doppler and duplex ultrasound were performed of bilateral carotid and vertebral arteries in the neck. COMPARISON:  None. FINDINGS: Criteria: Quantification of carotid stenosis is based on velocity parameters that correlate the residual internal carotid diameter with NASCET-based stenosis levels, using the diameter of the distal internal carotid lumen as the denominator for stenosis measurement. The following velocity measurements were obtained: RIGHT ICA:  108/18 cm/sec CCA:  [id number] cm/sec SYSTOLIC ICA/CCA RATIO:  0.8 DIASTOLIC ICA/CCA RATIO:  0.6 ECA:  164 cm/sec LEFT ICA:  108/22 cm/sec CCA:  [id number] cm/sec SYSTOLIC ICA/CCA RATIO:  0.6 DIASTOLIC ICA/CCA RATIO:  0.8 ECA:  148 cm/sec RIGHT CAROTID ARTERY: There is a minimal amount of eccentric mixed echogenic plaque within the right carotid bulb (image 21), extending to involve the origin and proximal aspects of the right internal carotid artery (image 29), not resulting in elevated peak systolic velocities within the interrogated course the  right internal carotid artery to suggest a hemodynamically significant stenosis. RIGHT VERTEBRAL ARTERY:  Antegrade flow LEFT CAROTID ARTERY: There is a minimal amount of intimal thickening within the left carotid bulb (image 55). There is a minimal amount of eccentric mixed echogenic plaque involving the origin and proximal aspects of the left internal carotid artery (image 63), not resulting in elevated peak systolic velocities within the interrogated course of the left internal carotid artery to suggest a hemodynamically significant stenosis. LEFT VERTEBRAL ARTERY:  Antegrade flow IMPRESSION: Minimal amount of bilateral atherosclerotic plaque, right greater than left, not resulting in a hemodynamically significant stenosis within either internal carotid artery. Electronically Signed   By: Sandi Mariscal M.D.   On: 10/28/2015 14:47   Mr Lovenia Kim  10/28/2015  CLINICAL DATA:  Stroke.  Right arm weakness. EXAM: MRI HEAD WITHOUT CONTRAST MRA HEAD WITHOUT CONTRAST TECHNIQUE: Multiplanar, multiecho pulse sequences of the brain and surrounding structures were obtained without intravenous contrast. Angiographic images of the head were obtained using MRA technique without contrast. COMPARISON:  CT 10/20/2015 FINDINGS: MRI HEAD FINDINGS Negative for acute infarct. Negative for chronic ischemic change. Negative for demyelinating disease. Ventricle size normal.  Cerebral volume  normal for age. Negative for intracranial hemorrhage.  Negative for mass or edema. Pituitary normal in size. Normal skullbase. Heterogeneous bone marrow in the cervical spine, uncertain significance. No focal mass lesion. Circle of Willis patent. Normal orbit.  Mild mucosal edema paranasal sinuses bilaterally. MRA HEAD FINDINGS Left vertebral dominant and widely patent. Left PICA patent. Right vertebral artery is markedly hypoplastic. Right PICA patent. Basilar widely patent. Left AICA patent. Fetal origin right posterior cerebral artery with  hypoplastic right P1 segment. Superior cerebellar and posterior cerebral arteries patent bilaterally. Cavernous carotid widely patent bilaterally without stenosis or aneurysm. Anterior and middle cerebral arteries widely patent without stenosis or occlusion. Negative for cerebral aneurysm IMPRESSION: No significant intracranial abnormality. Mild mucosal edema paranasal sinuses. Heterogeneous bone marrow in the cervical spine without focal mass lesion. Questionable significance. Negative MRA head Electronically Signed   By: Franchot Gallo M.D.   On: 10/28/2015 14:04    EKG:   Orders placed or performed during the hospital encounter of 10/28/15  . ED EKG  . ED EKG  . EKG 12-Lead  . EKG 12-Lead      Management plans discussed with the patient, family and they are in agreement.  CODE STATUS:     Code Status Orders        Start     Ordered   10/28/15 1034  Full code   Continuous     10/28/15 1034    Code Status History    Date Active Date Inactive Code Status Order ID Comments User Context   This patient has a current code status but no historical code status.      TOTAL TIME TAKING CARE OF THIS PATIENT: 40 minutes.    Theodoro Grist M.D on 10/29/2015 at 10:57 AM  Between 7am to 6pm - Pager - [phone]  After 6pm go to [email or web address] - password [email or web address] [facility]  Office  [phone]  CC: Primary care physician; Mayford Knife, MD

## 2015-10-29 NOTE — Progress Notes (Signed)
Pt being discharged home, discharge instructions reviewed with pt, states understanding, pt with no noted complaints at discharge

## 2016-08-03 ENCOUNTER — Emergency Department: Payer: BLUE CROSS/BLUE SHIELD

## 2016-08-03 ENCOUNTER — Encounter: Payer: Self-pay | Admitting: Emergency Medicine

## 2016-08-03 ENCOUNTER — Emergency Department
Admission: EM | Admit: 2016-08-03 | Discharge: 2016-08-04 | Disposition: A | Discharge status: Home / Self Care | Payer: BLUE CROSS/BLUE SHIELD | Attending: Emergency Medicine | Admitting: Emergency Medicine

## 2016-08-03 DIAGNOSIS — Z859 Personal history of malignant neoplasm, unspecified: Secondary | ICD-10-CM | POA: Insufficient documentation

## 2016-08-03 DIAGNOSIS — I10 Essential (primary) hypertension: Secondary | ICD-10-CM | POA: Diagnosis not present

## 2016-08-03 DIAGNOSIS — R0789 Other chest pain: Secondary | ICD-10-CM | POA: Diagnosis present

## 2016-08-03 DIAGNOSIS — N289 Disorder of kidney and ureter, unspecified: Secondary | ICD-10-CM | POA: Diagnosis not present

## 2016-08-03 DIAGNOSIS — R079 Chest pain, unspecified: Secondary | ICD-10-CM

## 2016-08-03 DIAGNOSIS — Z79899 Other long term (current) drug therapy: Secondary | ICD-10-CM | POA: Diagnosis not present

## 2016-08-03 DIAGNOSIS — M549 Dorsalgia, unspecified: Secondary | ICD-10-CM

## 2016-08-03 HISTORY — DX: Transient cerebral ischemic attack, unspecified: G45.9

## 2016-08-03 HISTORY — DX: Malignant (primary) neoplasm, unspecified: C80.1

## 2016-08-03 LAB — CBC
HEMATOCRIT: 44.6 % (ref 40.0–52.0)
HEMOGLOBIN: 15.6 g/dL (ref 13.0–18.0)
MCH: 31.6 pg (ref 26.0–34.0)
MCHC: 35 g/dL (ref 32.0–36.0)
MCV: 90.3 fL (ref 80.0–100.0)
Platelets: 238 10*3/uL (ref 150–440)
RBC: 4.94 MIL/uL (ref 4.40–5.90)
RDW: 14.6 % — ABNORMAL HIGH (ref 11.5–14.5)
WBC: 7.1 10*3/uL (ref 3.8–10.6)

## 2016-08-03 LAB — BASIC METABOLIC PANEL
ANION GAP: 7 (ref 5–15)
BUN: 15 mg/dL (ref 6–20)
CALCIUM: 8.9 mg/dL (ref 8.9–10.3)
CO2: 26 mmol/L (ref 22–32)
Chloride: 104 mmol/L (ref 101–111)
Creatinine, Ser: 1.28 mg/dL — ABNORMAL HIGH (ref 0.61–1.24)
GFR calc non Af Amer: 58 mL/min — ABNORMAL LOW (ref >60)
GLUCOSE: 153 mg/dL — AB (ref 65–99)
POTASSIUM: 3.8 mmol/L (ref 3.5–5.1)
Sodium: 137 mmol/L (ref 135–145)

## 2016-08-03 LAB — TROPONIN I: Troponin I: <0.03 ng/mL (ref <0.03)

## 2016-08-03 MED ORDER — IOPAMIDOL (ISOVUE-370) INJECTION 76%
100.0000 mL | Freq: Once | INTRAVENOUS | Status: AC | PRN
  Administered 2016-08-03: 100 mL via INTRAVENOUS

## 2016-08-03 NOTE — ED Notes (Signed)
Pt watching tv on cellphone. Pt denies needs, call bell at right side.

## 2016-08-03 NOTE — ED Notes (Signed)
Pt states at 1500 today while sitting at desk at home he began to experience central chest pain with radiation to mid upper back. Pt states history of CABGx1 in 2005. Pt states he has "pain in my chest, you know you are never right after that kind of surgery, but this felt different today, more pressure." pt states he took a ntg without relief and decided to drive to ed. Pt states pain was improved while checking into ed, but pain came back with increased intensity while waiting. Pt states pain is now down. Pt states with last episode radiation to right arm occurred. Skin pwd, resps unlabored.

## 2016-08-03 NOTE — ED Triage Notes (Addendum)
Pt complains of pressure to center of chest that is radiating into back. Pt states he has some tingling to right upper arm. Pt states pain started while sitting at desk about 1.5 hours PTA. Pt took 1 SL Nitro at 315 and did notice some relief.

## 2016-08-03 NOTE — ED Notes (Signed)
Pt denies needs at this time. Vss.

## 2016-08-03 NOTE — ED Notes (Signed)
Report to noel, rn.  

## 2016-08-03 NOTE — Discharge Instructions (Signed)
Please make an appointment with your cardiologist to follow-up for your chest pain, and for evaluation for possible repeat stress test.  Please make an appointment with your regular doctor to have your kidney function rechecked; it was slightly abnormal today. You can help this abnormality by drinking plenty of fluid and staying well hydrated.  Return to the emergency department if you develo chest pain, shortness of breath, lightheadedness or fainting, palpitations, or any other symptoms concerning to you.

## 2016-08-03 NOTE — ED Provider Notes (Signed)
[facility] Emergency Department Provider Note  ____________________________________________  Time seen: Approximately 8:13 PM  I have reviewed the triage vital signs and the nursing notes.   HISTORY  Chief Complaint Chest Pain    HPI Joel Miranda is a 61 y.o. male with a history of CAD status post CABG on daily Plavix, HTN, HL, TIA presentingwith chest pain. The patient reports that he was working from home, sitting down, when he developed a central chest pressure with radiation of a "dull ache" between the shoulder blades. He took a sublingual nitroglycerin, and the pain resolved. After 20 minutes, the pain came back and self resolved while in the waiting room of the emergency department.  He reports "I get weird sensations in the chest fairly frequently, but this was just worse than what I have experienced in the past." The patient denies any associated symptoms including shortness of breath, palpitations, lightheadedness, diaphoresis, syncope. No tearing sensation in the back. Last stress test was in April which is reportedly normal.   Past Medical History:  Diagnosis Date  . Cancer ([place])   . Hypertension   . TIA (transient ischemic attack)     Patient Active Problem List   Diagnosis Date Noted  . Essential hypertension 10/29/2015  . Hyperlipidemia 10/29/2015  . Obesity 10/29/2015  . Renal insufficiency 10/29/2015  . Hyperglycemia 10/29/2015  . TIA (transient ischemic attack) 10/28/2015    Past Surgical History:  Procedure Laterality Date  . CORONARY ARTERY BYPASS GRAFT      Current Outpatient Rx  . Order #: [id number] Class: Historical Med  . Order #: [id number] Class: Normal  . Order #: [id number] Class: Historical Med  . Order #: [id number] Class: Historical Med  . Order #: [id number] Class: Historical Med  . Order #: [id number] Class: Historical Med  . Order #: [id number] Class: Historical Med    Allergies Lisinopril  Family History  Problem  Relation Age of Onset  . Hypertension Other     Social History Social History  Substance Use Topics  . Smoking status: Never Smoker  . Smokeless tobacco: Never Used  . Alcohol use Yes     Comment: occasional    Review of Systems Constitutional: No fever/chills.No lightheadedness or syncope. Eyes: No visual changes. ENT: No sore throat. No congestion or rhinorrhea. Cardiovascular: Positive chest pain with radiation between the shoulder blades. Denies palpitations. Respiratory: Denies shortness of breath.  No cough. Gastrointestinal: No abdominal pain.  No nausea, no vomiting.  No diarrhea.  No constipation. Genitourinary: Negative for dysuria. Musculoskeletal: Positive for back pain. Skin: Negative for rash. Neurological: Negative for headaches. No focal numbness, tingling or weakness.   10-point ROS otherwise negative.  ____________________________________________   PHYSICAL EXAM:  VITAL SIGNS: ED Triage Vitals [08/03/16 1609]  Enc Vitals Group     BP      Pulse      Resp      Temp      Temp src      SpO2      Weight 255 lb (115.7 kg)     Height 6\' 1"  (1.854 m)     Head Circumference      Peak Flow      Pain Score 3     Pain Loc      Pain Edu?      Excl. in [place]?     Constitutional: Alert and oriented. Well appearing and in no acute distress. Answers questions appropriately.Able tablet without any difficulty. Appears comfortable. Eyes: Conjunctivae are normal.  EOMI. No  scleral icterus. Head: Atraumatic. Nose: No congestion/rhinnorhea. Mouth/Throat: Mucous membranes are moist.  Neck: No stridor.  Supple.  No JVD. Cardiovascular: Normal rate, regular rhythm. No murmurs, rubs or gallops.  Respiratory: Normal respiratory effort.  No accessory muscle use or retractions. Lungs CTAB.  No wheezes, rales or ronchi. Gastrointestinal: Soft, nontender and nondistended.  No guarding or rebound.  No peritoneal signs. Musculoskeletal: No LE edema. No ttp in the calves or  palpable cords.  Negative Homan's sign. Neurologic:  A&Ox3.  Speech is clear.  Face and smile are symmetric.  EOMI.  Moves all extremities well. Skin:  Skin is warm, dry and intact. No rash noted. Psychiatric: Mood and affect are normal. Speech and behavior are normal.  Normal judgement.  ____________________________________________   LABS (all labs ordered are listed, but only abnormal results are displayed)  Labs Reviewed  BASIC METABOLIC PANEL - Abnormal; Notable for the following:       Result Value   Glucose, Bld 153 (*)    Creatinine, Ser 1.28 (*)    GFR calc non Af Amer 58 (*)    All other components within normal limits  CBC - Abnormal; Notable for the following:    RDW 14.6 (*)    All other components within normal limits  TROPONIN I  TROPONIN I   ____________________________________________  EKG  ED ECG REPORT I, Eula Listen, the attending physician, personally viewed and interpreted this ECG.   Date: 08/03/2016  EKG Time: 1608  Rate: 85  Rhythm: normal sinus rhythm  Axis: normal  Intervals:none  ST&T Change: Nonspecific T-wave inversions in V1 and V2. No ST elevation.  ____________________________________________  RADIOLOGY  No results found.  ____________________________________________   PROCEDURES  Procedure(s) performed: None  Procedures  Critical Care performed: No ____________________________________________   INITIAL IMPRESSION / ASSESSMENT AND PLAN / ED COURSE  Pertinent labs & imaging results that were available during my care of the patient were reviewed by me and considered in my medical decision making (see chart for details).  61 y.o. male with a history of CAD status post CABG presenting with chest pain radiating to the back. Overall, the patient has stable vital signs and a reassuring examination. At this time, he is symptom-free. His EKG does not show ischemic changes, his troponin is negative, and his chest x-ray does  not show any acute cardiopulmonary process. I'll plan to do a second troponin. Given the back pain, will also get a CT angiogram to rule out aortic pathology. PE is much less likely. The patient may also have GI symptoms, including GERD, peptic ulcer disease, or esophageal spasm although he does not link any of his symptoms to food, no acid type symptoms and no burping.  The patient is an established patient of Dr. Nehemiah Massed, and if his CT angiogram is negative, he remains chest pain-free, and his repeat troponin is negative, I'll plan to discharge him home with close PMD follow-up as well as cardiology follow-up for an outpatient stress test  ____________________________________________  FINAL CLINICAL IMPRESSION(S) / ED DIAGNOSES  Final diagnoses:  Chest pain, unspecified type  Discomfort of back  Acute renal insufficiency         NEW MEDICATIONS STARTED DURING THIS VISIT:  Discharge Medication List as of 08/03/2016 11:26 PM        Eula Listen, MD 08/08/16 1928

## 2016-08-03 NOTE — ED Notes (Signed)
Patient transported to CT 

## 2018-03-26 IMAGING — CT CT ANGIO CHEST
3 of 7 series · 18 of 46 positions shown · IV contrast (isovue)
Comparison: Chest radiograph performed earlier today at [DATE] p.m.

CLINICAL DATA: Acute onset of central chest pain, radiating to the
mid upper back. Initial encounter.

EXAM:
CT ANGIOGRAPHY CHEST WITH CONTRAST
TECHNIQUE: Multidetector CT imaging of the chest was performed using the
standard protocol during bolus administration of intravenous
contrast. Multiplanar CT image reconstructions and MIPs were
obtained to evaluate the vascular anatomy.
CONTRAST:  100 mL of Isovue 370 IV contrast

[Series 6: axial arterial · axial · arterial · 0.78mm/px · z∈[-519,-246]mm · 12 of 109 slices shown]
[im 9/109  lung]
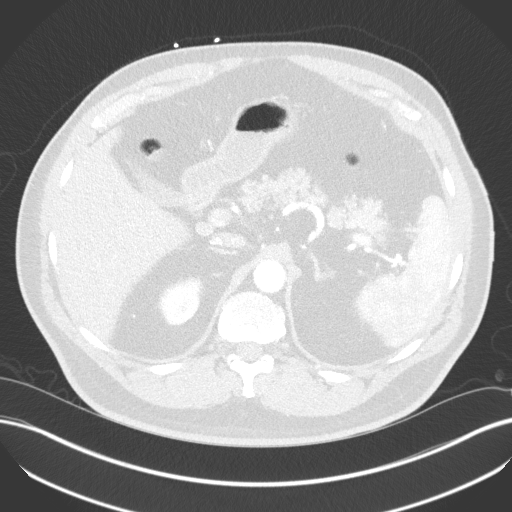
[im 17/109  soft-tissue]
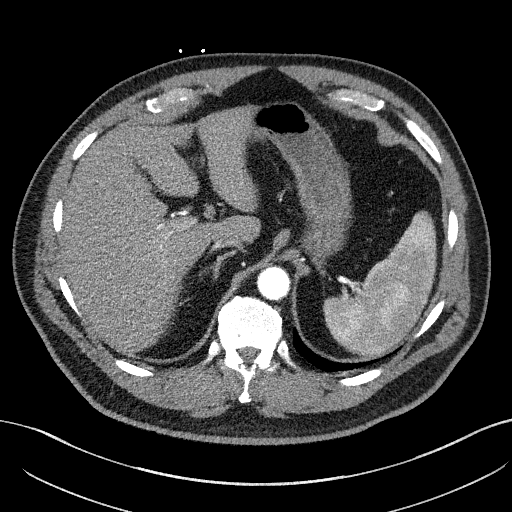
[im 25/109  lung]
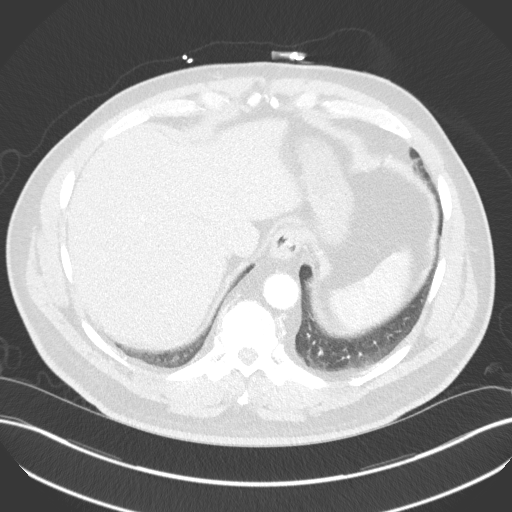
[im 34/109  soft-tissue]
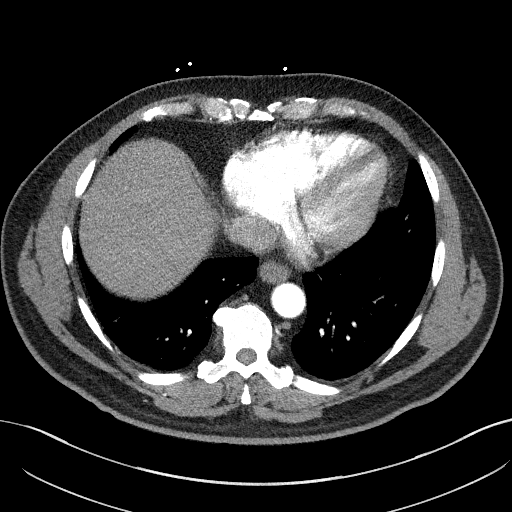
[im 42/109  lung]
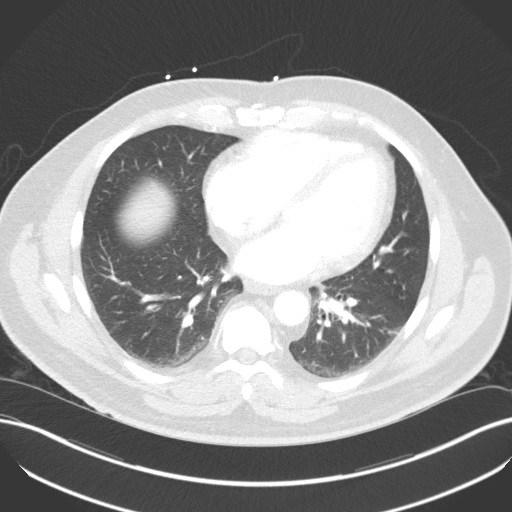
[im 50/109  soft-tissue]
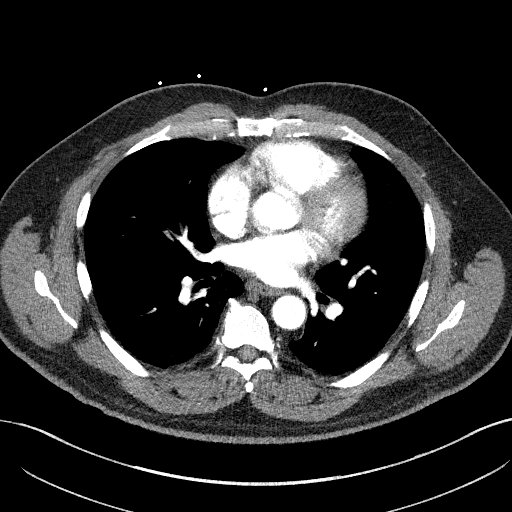
[im 59/109  lung]
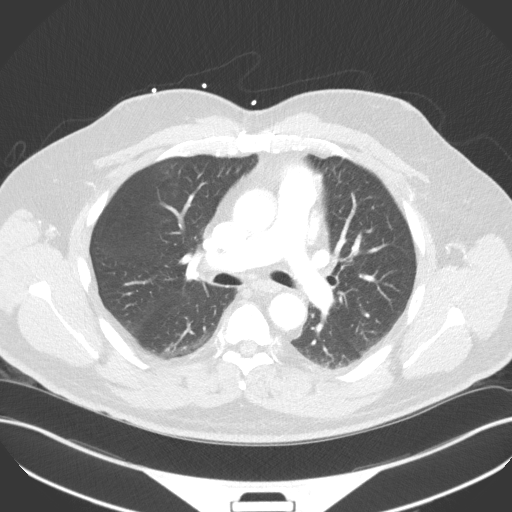
[im 67/109  soft-tissue]
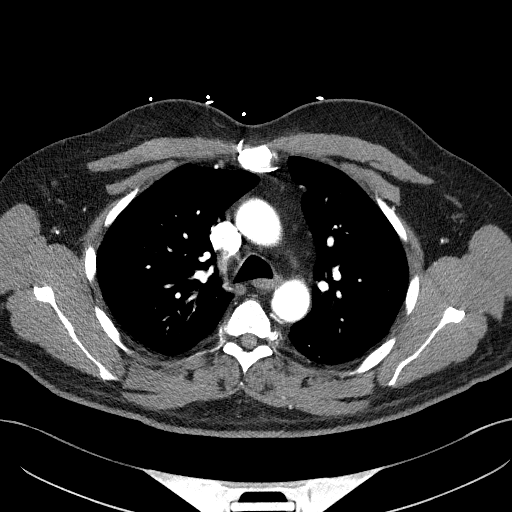
[im 75/109  lung]
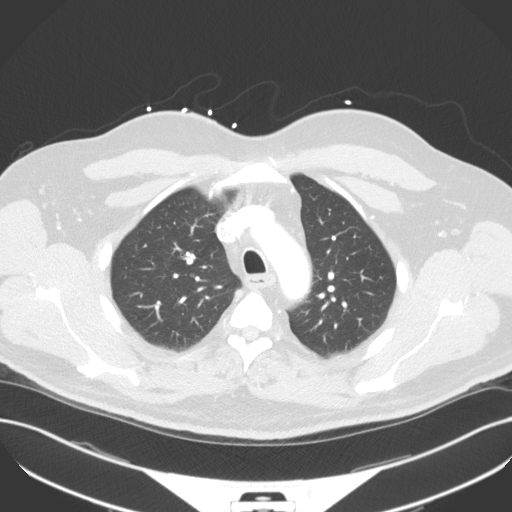
[im 84/109  soft-tissue]
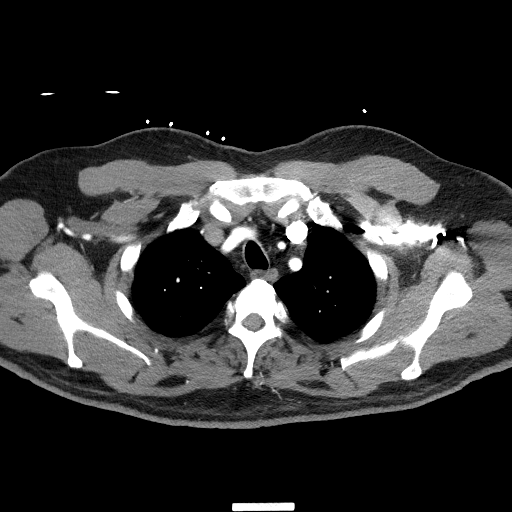
[im 92/109  lung]
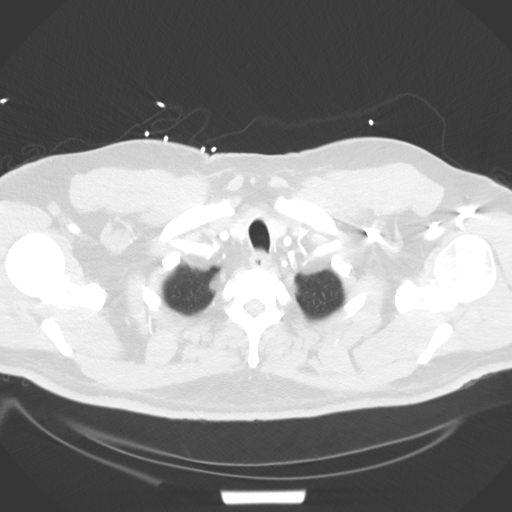
[im 100/109  soft-tissue]
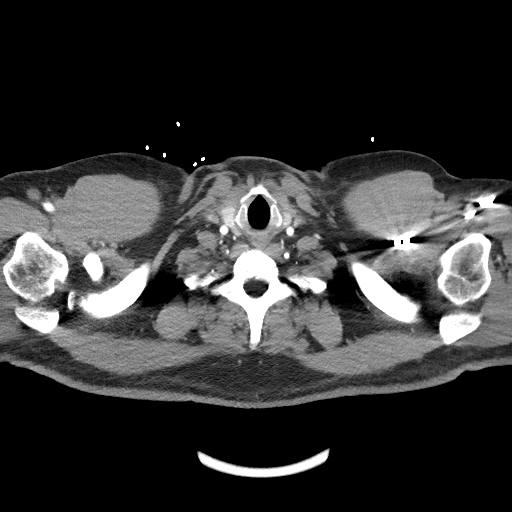

[Series 7: lung · axial · 0.78mm/px · z∈[-504,-424]mm · 3 of 66 slices shown]
[im 9/66  soft-tissue]
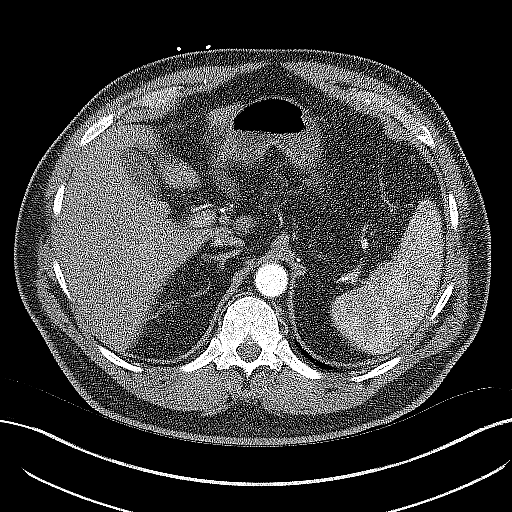
[im 17/66  soft-tissue]
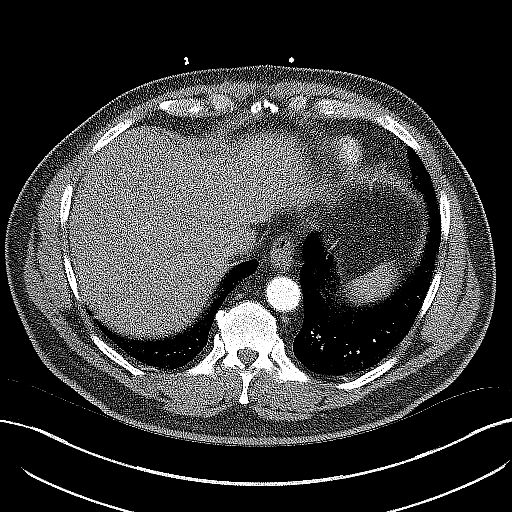
[im 25/66  soft-tissue]
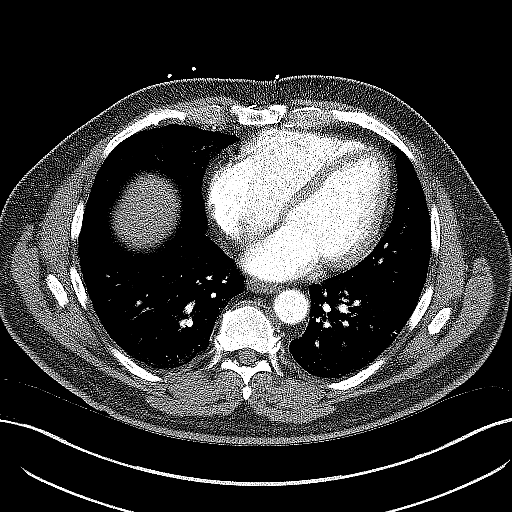

[Series 8: coronals · coronal · 0.71mm/px · 3 of 135 slices shown]
[im 34/135  soft-tissue]
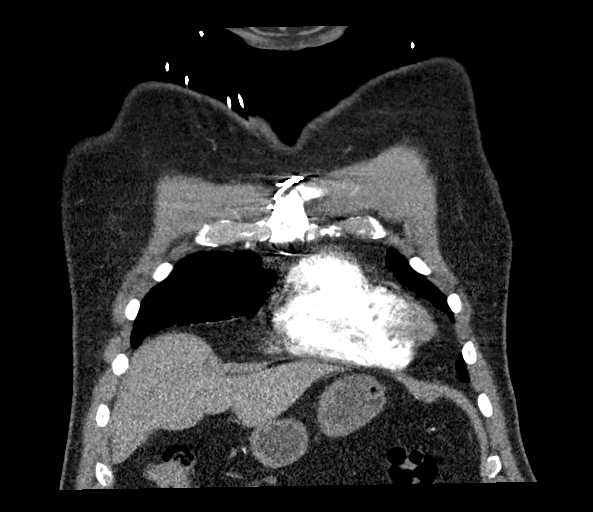
[im 68/135  soft-tissue]
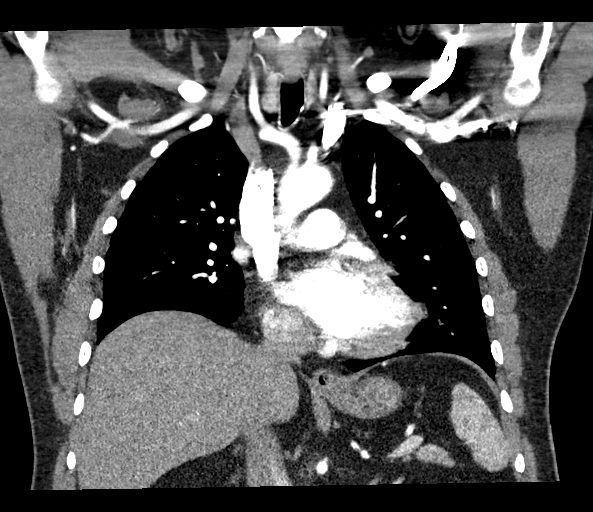
[im 101/135  soft-tissue]
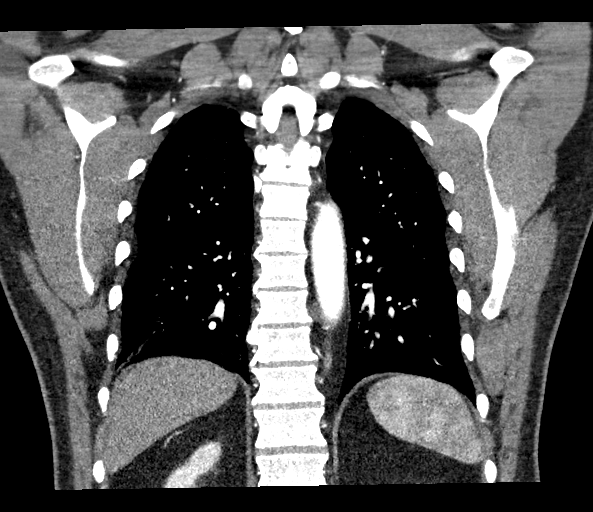

[18 of 46 positions shown; findings below may reference images not displayed]

FINDINGS: Cardiovascular: There is no evidence of aortic dissection. There is
no evidence of aneurysmal dilatation. The great vessels are grossly
unremarkable. There is no evidence of significant pulmonary embolus.

Minimal calcification is seen at the aortic arch. The heart remains
normal in size.

Mediastinum/Nodes: The mediastinum is unremarkable in appearance. No
mediastinal lymphadenopathy is seen. No pericardial effusion is
identified. The patient is status post median sternotomy. The
thyroid gland is unremarkable. No axillary lymphadenopathy is seen.

Lungs/Pleura: Minimal bibasilar atelectasis is noted. The lungs are
otherwise clear. No pleural effusion or pneumothorax is seen. No
masses are identified.

Upper Abdomen: The visualized portions of the liver and spleen are
grossly unremarkable. The visualized portions of the gallbladder,
pancreas and adrenal glands are within normal limits.

Musculoskeletal: No acute osseous abnormalities are identified. The
visualized musculature is unremarkable in appearance.

Review of the MIP images confirms the above findings.
IMPRESSION: 1. No evidence of aortic dissection. No evidence of aneurysmal
dilatation.
2. No evidence of significant pulmonary embolus.
3. Minimal bibasilar atelectasis noted.  Lungs otherwise clear.

## 2020-08-31 ENCOUNTER — Other Ambulatory Visit: Payer: Self-pay

## 2020-08-31 ENCOUNTER — Emergency Department: Payer: BC Managed Care – PPO

## 2020-08-31 ENCOUNTER — Emergency Department
Admission: EM | Admit: 2020-08-31 | Discharge: 2020-08-31 | Disposition: A | Discharge status: Home / Self Care | Payer: BC Managed Care – PPO | Attending: Emergency Medicine | Admitting: Emergency Medicine

## 2020-08-31 DIAGNOSIS — Z951 Presence of aortocoronary bypass graft: Secondary | ICD-10-CM | POA: Diagnosis not present

## 2020-08-31 DIAGNOSIS — R079 Chest pain, unspecified: Secondary | ICD-10-CM

## 2020-08-31 DIAGNOSIS — I2510 Atherosclerotic heart disease of native coronary artery without angina pectoris: Secondary | ICD-10-CM | POA: Diagnosis not present

## 2020-08-31 DIAGNOSIS — I10 Essential (primary) hypertension: Secondary | ICD-10-CM | POA: Diagnosis not present

## 2020-08-31 DIAGNOSIS — Z859 Personal history of malignant neoplasm, unspecified: Secondary | ICD-10-CM | POA: Diagnosis not present

## 2020-08-31 LAB — BASIC METABOLIC PANEL
Anion gap: 9 (ref 5–15)
BUN: 20 mg/dL (ref 8–23)
CO2: 23 mmol/L (ref 22–32)
Calcium: 9.3 mg/dL (ref 8.9–10.3)
Chloride: 106 mmol/L (ref 98–111)
Creatinine, Ser: 1.26 mg/dL — ABNORMAL HIGH (ref 0.61–1.24)
GFR, Estimated: >60 mL/min (ref >60)
Glucose, Bld: 100 mg/dL — ABNORMAL HIGH (ref 70–99)
Potassium: 3.9 mmol/L (ref 3.5–5.1)
Sodium: 138 mmol/L (ref 135–145)

## 2020-08-31 LAB — CBC
HCT: 43.6 % (ref 39.0–52.0)
Hemoglobin: 14.8 g/dL (ref 13.0–17.0)
MCH: 31.3 pg (ref 26.0–34.0)
MCHC: 33.9 g/dL (ref 30.0–36.0)
MCV: 92.2 fL (ref 80.0–100.0)
Platelets: 236 10*3/uL (ref 150–400)
RBC: 4.73 MIL/uL (ref 4.22–5.81)
RDW: 13.7 % (ref 11.5–15.5)
WBC: 7.5 10*3/uL (ref 4.0–10.5)
nRBC: 0 % (ref 0.0–0.2)

## 2020-08-31 LAB — TROPONIN I (HIGH SENSITIVITY)
Troponin I (High Sensitivity): 3 ng/L (ref <18)
Troponin I (High Sensitivity): 4 ng/L (ref <18)

## 2020-08-31 NOTE — ED Notes (Signed)
Pt had episode of left side chest pain this afternoon.  Pt took 2 ntg with no difference in pain.  No chest pain now or sob.  No n/v/d   No diaphoresis.  No cough.  Hx cabg  Pt alert  Pt in hallway bed.

## 2020-08-31 NOTE — ED Provider Notes (Signed)
[facility] Emergency Department Provider Note   ____________________________________________   Event Date/Time   First MD Initiated Contact with Patient 08/31/20 2113     (approximate)  I have reviewed the triage vital signs and the nursing notes.   HISTORY  Chief Complaint Chest Pain    HPI Joel Miranda is a 65 y.o. male with past medical history of hypertension, hyperlipidemia, and CAD status post CABG who presents to the ED for chest pain.  Patient reports that he woke up this morning with sharp pain over the left side of his chest.  He describes it as similar to when he has dealt with muscle spasms in his pectoral muscle in the past.  He states he has dealt with these episodes of muscle spasm ever since his CABG 17 years ago.  He then had a few additional episodes of similar pain later this afternoon, which were again similar to what he has dealt with in the past.  These episodes did occur more frequently than he is used to today and so he decided to be further evaluated.  Chest pain has since resolved and he denies any fevers, cough, or shortness of breath.  He took 2 nitroglycerin at home with minimal relief, but symptoms seem to resolve on their own later.        Past Medical History:  Diagnosis Date  . Cancer ([place])   . Hypertension   . TIA (transient ischemic attack)     Patient Active Problem List   Diagnosis Date Noted  . Essential hypertension 10/29/2015  . Hyperlipidemia 10/29/2015  . Obesity 10/29/2015  . Renal insufficiency 10/29/2015  . Hyperglycemia 10/29/2015  . TIA (transient ischemic attack) 10/28/2015    Past Surgical History:  Procedure Laterality Date  . CORONARY ARTERY BYPASS GRAFT      Prior to Admission medications   Medication Sig Start Date End Date Taking? Authorizing Provider  cholecalciferol (VITAMIN D) 1000 units tablet Take 1,000 Units by mouth daily.    [provider]  clopidogrel (PLAVIX) 75 MG  tablet Take 1 tablet (75 mg total) by mouth daily. 10/29/15   Theodoro Grist, MD  losartan (COZAAR) 50 MG tablet Take 1 tablet by mouth 2 (two) times daily. 06/28/15   [provider]  Multiple Vitamin (MULTIVITAMIN WITH MINERALS) TABS tablet Take 1 tablet by mouth daily.    [provider]  nitroGLYCERIN (NITROSTAT) 0.4 MG SL tablet Place 0.4 mg under the tongue every 5 (five) minutes as needed.  07/20/14   [provider]  rosuvastatin (CRESTOR) 20 MG tablet Take 1 tablet by mouth at bedtime.  04/22/15   [provider]  triamcinolone cream (KENALOG) 0.1 % Apply 1 application topically as needed. 07/13/15   [provider]    Allergies Lisinopril  Family History  Problem Relation Age of Onset  . Hypertension Other     Social History Social History   Tobacco Use  . Smoking status: Never Smoker  . Smokeless tobacco: Never Used  Substance Use Topics  . Alcohol use: Yes    Comment: occasional    Review of Systems  Constitutional: No fever/chills Eyes: No visual changes. ENT: No sore throat. Cardiovascular: Positive for chest pain. Respiratory: Denies shortness of breath. Gastrointestinal: No abdominal pain.  No nausea, no vomiting.  No diarrhea.  No constipation. Genitourinary: Negative for dysuria. Musculoskeletal: Negative for back pain. Skin: Negative for rash. Neurological: Negative for headaches, focal weakness or numbness.  ____________________________________________  PHYSICAL EXAM:  VITAL SIGNS: ED Triage Vitals  Enc Vitals Group     BP 08/31/20 1716 125/75     Pulse Rate 08/31/20 1716 69     Resp 08/31/20 1716 18     Temp 08/31/20 1716 98.1 F (36.7 C)     Temp src --      SpO2 08/31/20 1716 100 %     Weight 08/31/20 1714 235 lb (106.6 kg)     Height 08/31/20 1714 6\' 1"  (1.854 m)     Head Circumference --      Peak Flow --      Pain Score 08/31/20 1714 0     Pain Loc --      Pain Edu? --      Excl. in [place]?  --     Constitutional: Alert and oriented. Eyes: Conjunctivae are normal. Head: Atraumatic. Nose: No congestion/rhinnorhea. Mouth/Throat: Mucous membranes are moist. Neck: Normal ROM Cardiovascular: Normal rate, regular rhythm. Grossly normal heart sounds.  2+ radial pulses bilaterally. Respiratory: Normal respiratory effort.  No retractions. Lungs CTAB.  Tenderness to palpation over left pectoral area. Gastrointestinal: Soft and nontender. No distention. Genitourinary: deferred Musculoskeletal: No lower extremity tenderness nor edema. Neurologic:  Normal speech and language. No gross focal neurologic deficits are appreciated. Skin:  Skin is warm, dry and intact. No rash noted. Psychiatric: Mood and affect are normal. Speech and behavior are normal.  ____________________________________________   LABS (all labs ordered are listed, but only abnormal results are displayed)  Labs Reviewed  BASIC METABOLIC PANEL - Abnormal; Notable for the following components:      Result Value   Glucose, Bld 100 (*)    Creatinine, Ser 1.26 (*)    All other components within normal limits  CBC  TROPONIN I (HIGH SENSITIVITY)  TROPONIN I (HIGH SENSITIVITY)   ____________________________________________  EKG  ED ECG REPORT I, Blake Divine, the attending physician, personally viewed and interpreted this ECG.   Date: 08/31/2020  EKG Time: 17:16  Rate: 72  Rhythm: normal sinus rhythm  Axis: Normal  Intervals:none  ST&T Change: None   PROCEDURES  Procedure(s) performed (including Critical Care):  Procedures   ____________________________________________   INITIAL IMPRESSION / ASSESSMENT AND PLAN / ED COURSE       65 year old male with past medical history of hypertension, hyperlipidemia, and CAD status post CABG who presents to the ED with intermittent episodes of pain over his left chest starting earlier today.  Patient admits some soreness with palpation of his left pectoral  area, although he states the pain he was having earlier has resolved.  EKG shows no evidence of arrhythmia or ischemia and 2 sets troponin are negative.  Given very atypical symptoms, I have a low suspicion for ACS.  I doubt PE or dissection given pain has resolved.  Chest x-ray reviewed by me and shows no infiltrate, edema, or effusion.  Patient is appropriate for discharge home with cardiology follow-up, was counseled to return to the ED for new or worsening symptoms.  Patient agrees with plan.      ____________________________________________   FINAL CLINICAL IMPRESSION(S) / ED DIAGNOSES  Final diagnoses:  Nonspecific chest pain     ED Discharge Orders    None       Note:  This document was prepared using Dragon voice recognition software and may include unintentional dictation errors.   Blake Divine, MD 08/31/20 [phone]

## 2020-08-31 NOTE — ED Triage Notes (Signed)
Pt comes via POV from home with c/o left sided CP that started this am. Pt has hx of bypass.  Pt states no SOB. Pt states 1 nitro at 2:45 and another 4:30 with little relief.  Pt denies any pain at this time but states tightness.

## 2022-04-23 IMAGING — CR DG CHEST 2V
2 series · 2 of 2 positions shown · non-contrast
Comparison: 08/03/2016, CT 08/03/2016

CLINICAL DATA: Chest pain

EXAM:
CHEST - 2 VIEW

[chest pa]
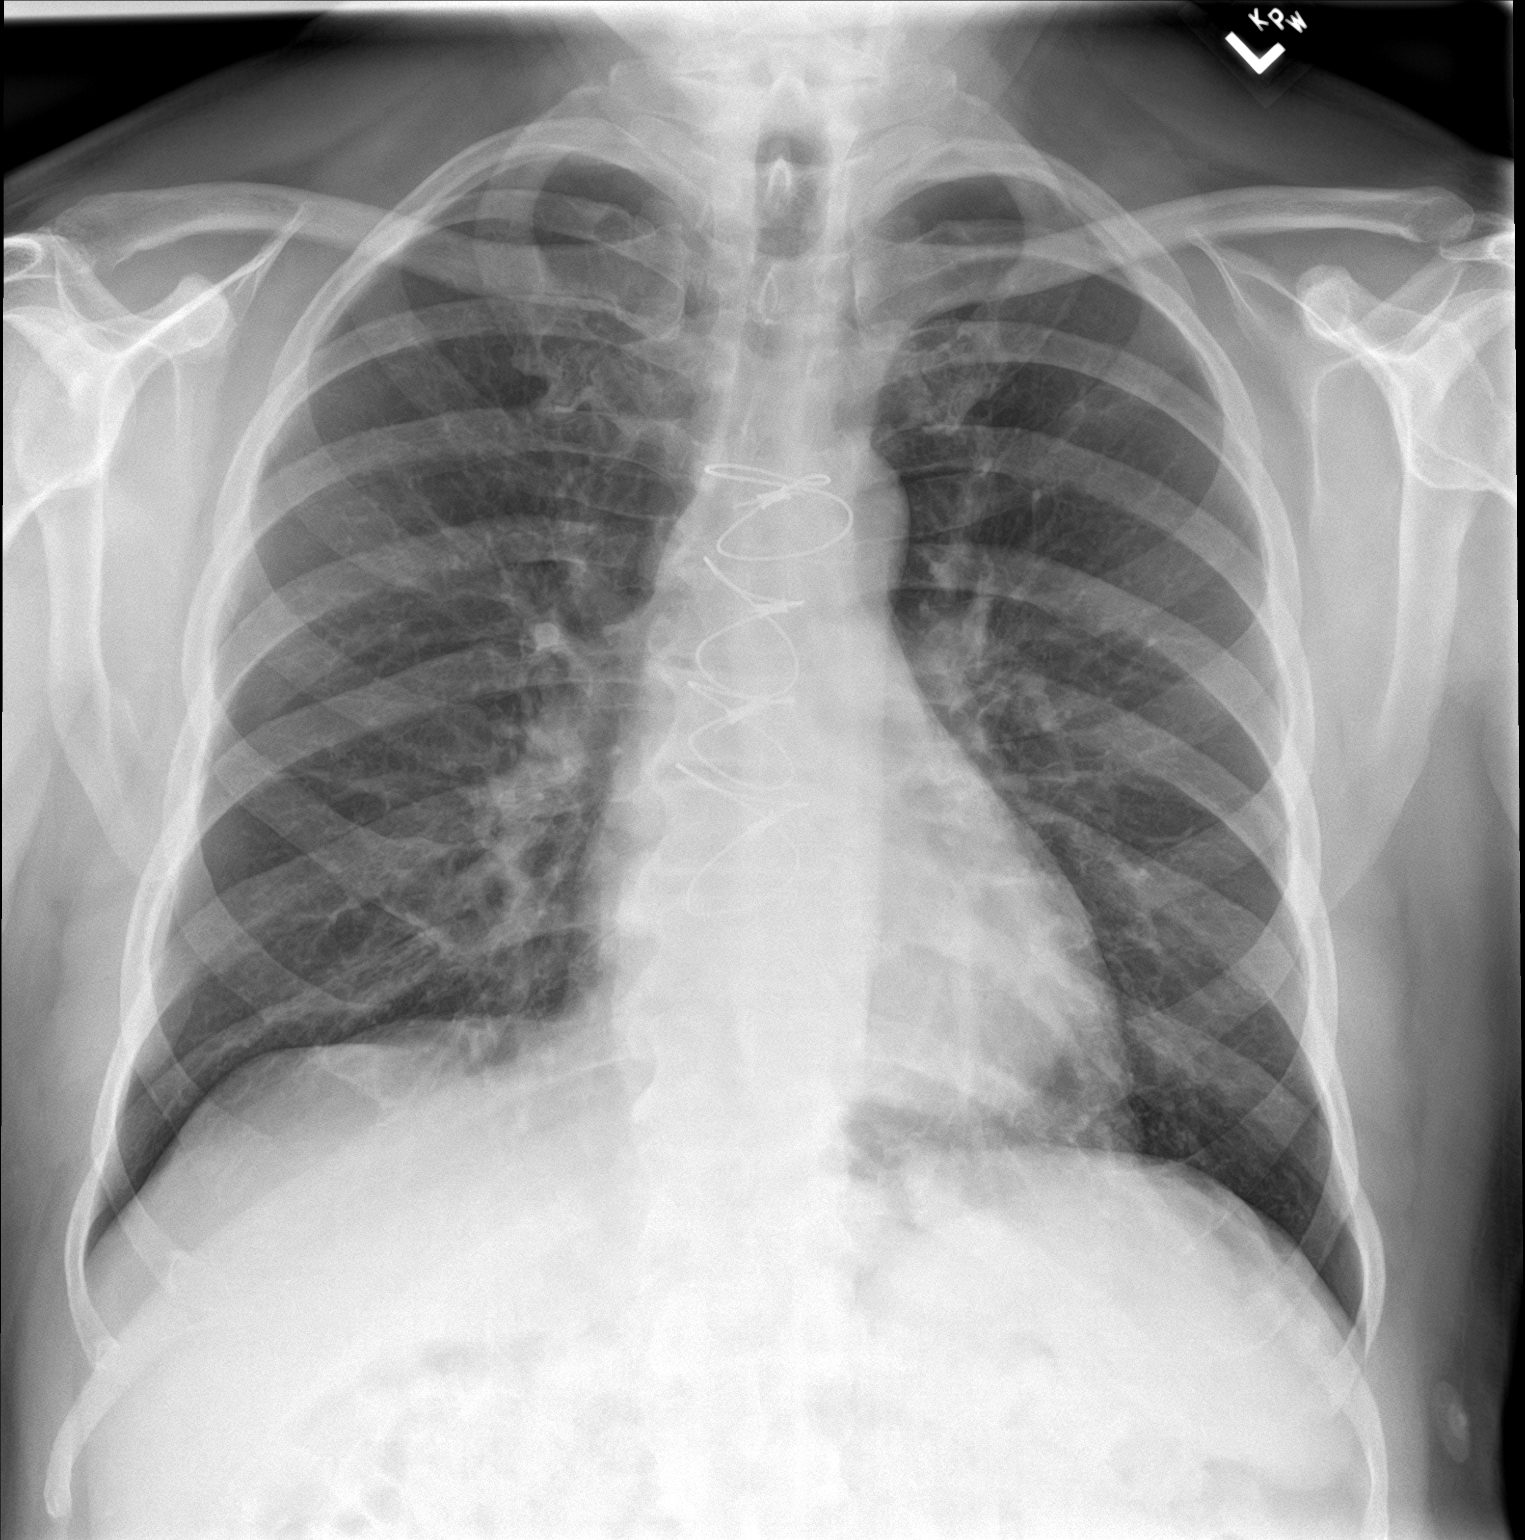

[chest lat]
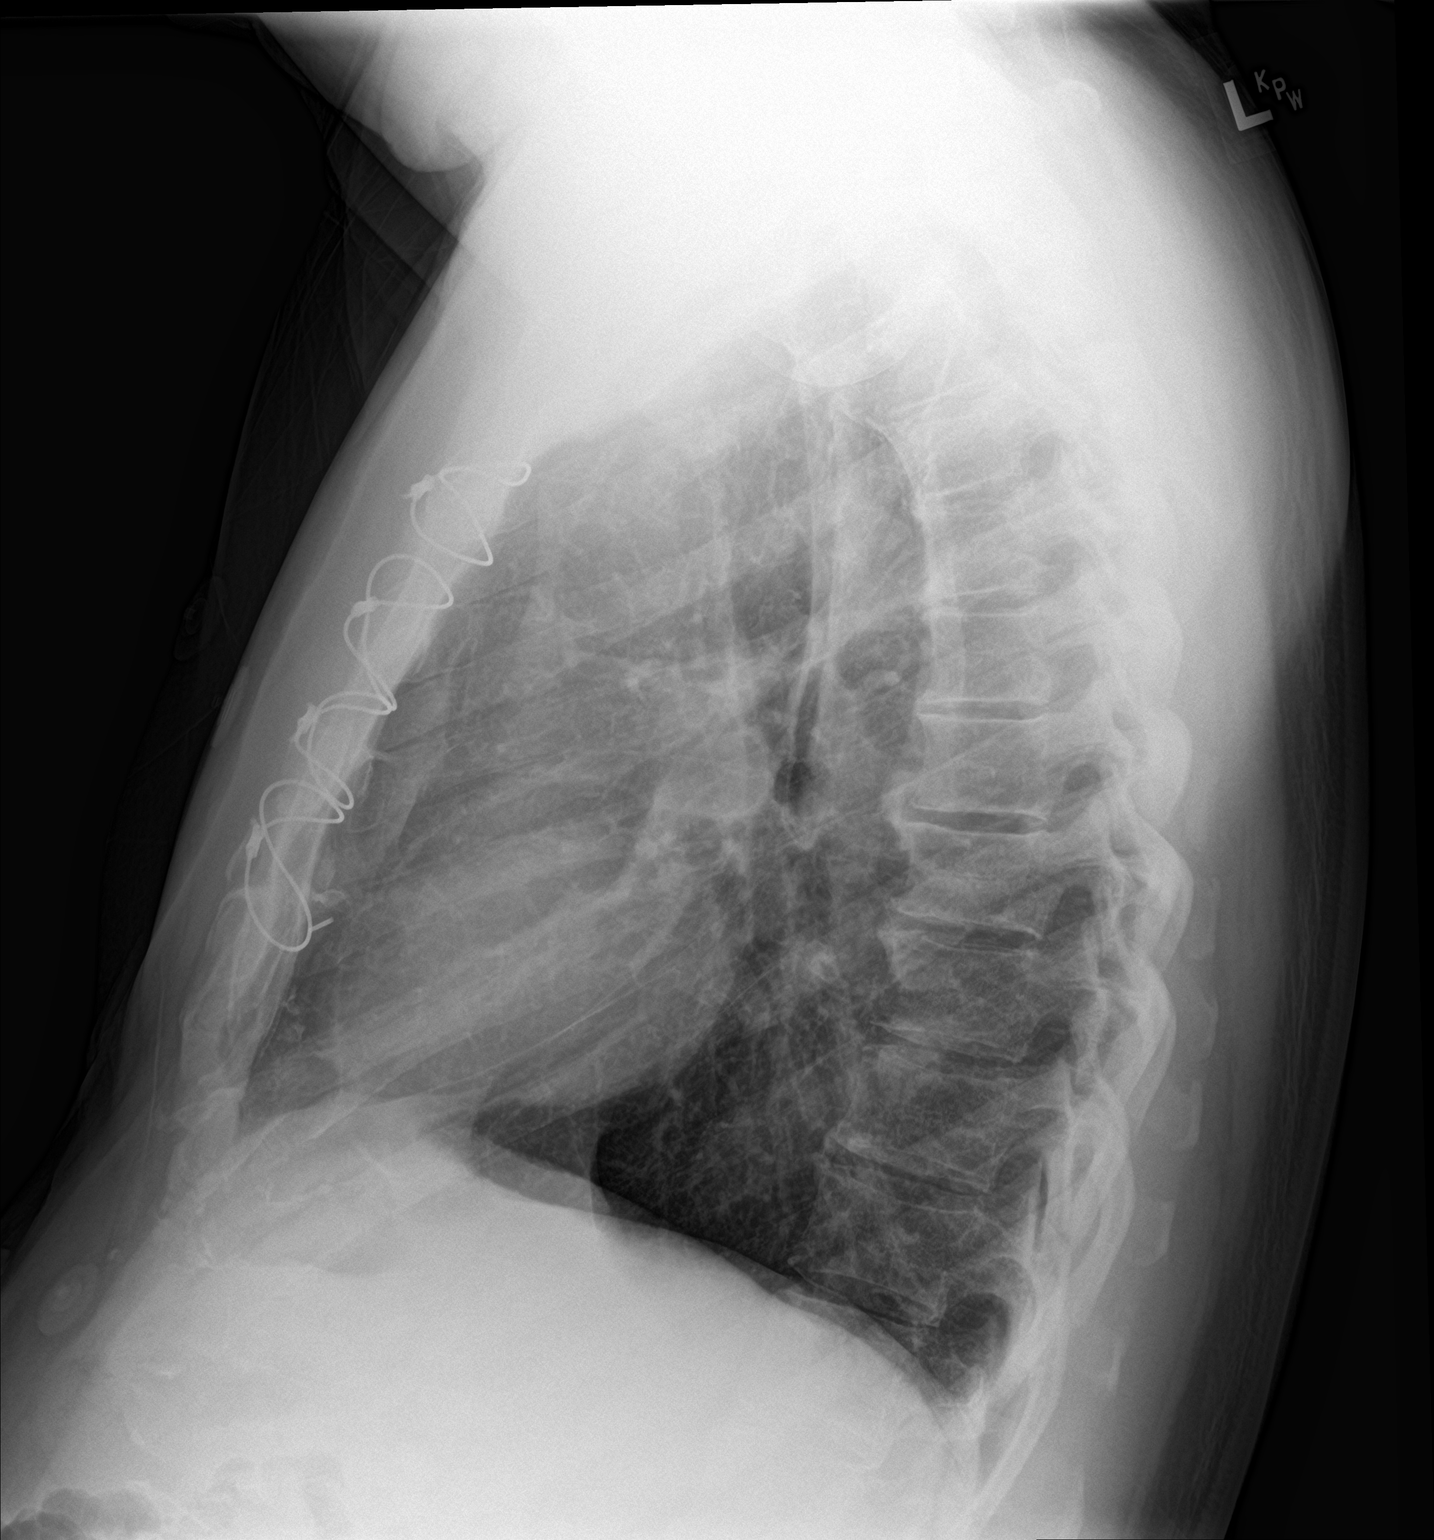

[2 of 2 positions shown; findings below may reference images not displayed]

FINDINGS: Post sternotomy changes. No focal opacity or pleural effusion.
Normal cardiomediastinal silhouette. No pneumothorax.
IMPRESSION: No active cardiopulmonary disease.
